# Patient Record
Sex: Male | Born: 1975 | Race: Black or African American | Hispanic: No | Smoking: Current some day smoker
Health system: Southern US, Community
[De-identification: ages and names within clinical notes are randomized; demographics above are authoritative.]

## PROBLEM LIST (undated history)

## (undated) DIAGNOSIS — C801 Malignant (primary) neoplasm, unspecified: Secondary | ICD-10-CM

## (undated) DIAGNOSIS — Z803 Family history of malignant neoplasm of breast: Secondary | ICD-10-CM

## (undated) DIAGNOSIS — Z800 Family history of malignant neoplasm of digestive organs: Secondary | ICD-10-CM

## (undated) HISTORY — PX: ANKLE FRACTURE SURGERY: SHX122

## (undated) HISTORY — DX: Family history of malignant neoplasm of digestive organs: Z80.0

## (undated) HISTORY — DX: Family history of malignant neoplasm of breast: Z80.3

## (undated) HISTORY — PX: FOOT SURGERY: SHX648

---

## 2002-09-16 ENCOUNTER — Emergency Department (HOSPITAL_COMMUNITY): Admission: EM | Admit: 2002-09-16 | Discharge: 2002-09-17 | Payer: Self-pay | Admitting: *Deleted

## 2002-09-16 ENCOUNTER — Encounter: Payer: Self-pay | Admitting: *Deleted

## 2015-05-22 ENCOUNTER — Emergency Department (HOSPITAL_COMMUNITY)
Admission: EM | Admit: 2015-05-22 | Discharge: 2015-05-22 | Disposition: A | Discharge status: Home / Self Care | Payer: Self-pay | Attending: Emergency Medicine | Admitting: Emergency Medicine

## 2015-05-22 ENCOUNTER — Encounter (HOSPITAL_COMMUNITY): Payer: Self-pay | Admitting: Emergency Medicine

## 2015-05-22 ENCOUNTER — Emergency Department (HOSPITAL_COMMUNITY): Payer: Self-pay

## 2015-05-22 DIAGNOSIS — K625 Hemorrhage of anus and rectum: Secondary | ICD-10-CM | POA: Insufficient documentation

## 2015-05-22 DIAGNOSIS — Z720 Tobacco use: Secondary | ICD-10-CM | POA: Insufficient documentation

## 2015-05-22 DIAGNOSIS — R079 Chest pain, unspecified: Secondary | ICD-10-CM | POA: Insufficient documentation

## 2015-05-22 LAB — COMPREHENSIVE METABOLIC PANEL
ALBUMIN: 4.2 g/dL (ref 3.5–5.0)
ALT: 16 U/L — ABNORMAL LOW (ref 17–63)
AST: 24 U/L (ref 15–41)
Alkaline Phosphatase: 45 U/L (ref 38–126)
Anion gap: 8 (ref 5–15)
BUN: 17 mg/dL (ref 6–20)
CHLORIDE: 104 mmol/L (ref 101–111)
CO2: 26 mmol/L (ref 22–32)
Calcium: 9.1 mg/dL (ref 8.9–10.3)
Creatinine, Ser: 1.27 mg/dL — ABNORMAL HIGH (ref 0.61–1.24)
GFR calc Af Amer: >60 mL/min (ref >60)
GFR calc non Af Amer: >60 mL/min (ref >60)
GLUCOSE: 106 mg/dL — AB (ref 65–99)
POTASSIUM: 3.3 mmol/L — AB (ref 3.5–5.1)
SODIUM: 138 mmol/L (ref 135–145)
Total Bilirubin: 0.8 mg/dL (ref 0.3–1.2)
Total Protein: 7.5 g/dL (ref 6.5–8.1)

## 2015-05-22 LAB — I-STAT TROPONIN, ED: Troponin i, poc: 0 ng/mL (ref 0.00–0.08)

## 2015-05-22 LAB — CBC WITH DIFFERENTIAL/PLATELET
BASOS ABS: 0 10*3/uL (ref 0.0–0.1)
BASOS PCT: 0 %
EOS PCT: 0 %
Eosinophils Absolute: 0 10*3/uL (ref 0.0–0.7)
HCT: 38.8 % — ABNORMAL LOW (ref 39.0–52.0)
Hemoglobin: 13.3 g/dL (ref 13.0–17.0)
Lymphocytes Relative: 20 %
Lymphs Abs: 1.7 10*3/uL (ref 0.7–4.0)
MCH: 30.5 pg (ref 26.0–34.0)
MCHC: 34.3 g/dL (ref 30.0–36.0)
MCV: 89 fL (ref 78.0–100.0)
MONO ABS: 0.6 10*3/uL (ref 0.1–1.0)
Monocytes Relative: 7 %
NEUTROS ABS: 6 10*3/uL (ref 1.7–7.7)
Neutrophils Relative %: 73 %
PLATELETS: 212 10*3/uL (ref 150–400)
RBC: 4.36 MIL/uL (ref 4.22–5.81)
RDW: 12.9 % (ref 11.5–15.5)
WBC: 8.2 10*3/uL (ref 4.0–10.5)

## 2015-05-22 LAB — D-DIMER, QUANTITATIVE: D-Dimer, Quant: <0.27 ug/mL-FEU (ref 0.00–0.48)

## 2015-05-22 MED ORDER — HYDROMORPHONE HCL 1 MG/ML IJ SOLN
1.0000 mg | Freq: Once | INTRAMUSCULAR | Status: AC
  Administered 2015-05-22: 1 mg via INTRAMUSCULAR
  Filled 2015-05-22: qty 1

## 2015-05-22 MED ORDER — NAPROXEN 500 MG PO TABS: 500.0000 mg | ORAL_TABLET | Freq: Two times a day (BID) | ORAL | Status: DC

## 2015-05-22 MED ORDER — HYDROCODONE-ACETAMINOPHEN 5-325 MG PO TABS: 1.0000 | ORAL_TABLET | Freq: Four times a day (QID) | ORAL | Status: DC | PRN

## 2015-05-22 NOTE — Discharge Instructions (Signed)
Follow up with a family md if you continue to have chest pain,  Call [phone] for an appointment,    Follow up with dr. Darrick PennaFields for your bleeding from rectum

## 2015-05-22 NOTE — ED Notes (Signed)
Pt c/o of LT sided, non-radiating CP with dizziness and SOB since last night. Pt also reports bright red blood in his stool.

## 2015-05-22 NOTE — ED Notes (Signed)
Pt alert & oriented x4, stable gait. Patient given discharge instructions, paperwork & prescription(s). Patient informed not to drive, operate any equipment & handel any important documents 4 hours after taking pain medication. Patient  instructed to stop at the registration desk to finish any additional paperwork. Patient  verbalized understanding. Pt left department w/ no further questions. 

## 2015-05-22 NOTE — ED Provider Notes (Signed)
CSN: [id number]     Arrival date & time 05/22/15  1709 History   First MD Initiated Contact with Patient 05/22/15 1719     Chief Complaint  Patient presents with  . Chest Pain     (Consider location/radiation/quality/duration/timing/severity/associated sxs/prior Treatment) Patient is a 39 y.o. male presenting with chest pain. The history is provided by the patient (Patient complains of left-sided chest pain worse with movement. Patient also complains of rectal bleeding for a year).  Chest Pain Pain location:  L chest Pain quality: aching   Pain radiates to:  Does not radiate Pain radiates to the back: no   Pain severity:  Moderate Onset quality:  Sudden Timing:  Constant Progression:  Waxing and waning Chronicity:  New Context: not breathing   Associated symptoms: no abdominal pain, no back pain, no cough, no fatigue and no headache     History reviewed. No pertinent past medical history. Past Surgical History  Procedure Laterality Date  . Ankle fracture surgery     No family history on file. Social History  Substance Use Topics  . Smoking status: Current Every Day Smoker -- 1.00 packs/day    Types: Cigarettes  . Smokeless tobacco: None  . Alcohol Use: No    Review of Systems  Constitutional: Negative for appetite change and fatigue.  HENT: Negative for congestion, ear discharge and sinus pressure.   Eyes: Negative for discharge.  Respiratory: Negative for cough.   Cardiovascular: Positive for chest pain.  Gastrointestinal: Negative for abdominal pain and diarrhea.       Rectal bleeding  Genitourinary: Negative for frequency and hematuria.  Musculoskeletal: Negative for back pain.  Skin: Negative for rash.  Neurological: Negative for seizures and headaches.  Psychiatric/Behavioral: Negative for hallucinations.      Allergies  Review of patient's allergies indicates no known allergies.  Home Medications   Prior to Admission medications   Medication Sig  Start Date End Date Taking? Authorizing Provider  HYDROcodone-acetaminophen (NORCO/VICODIN) 5-325 MG tablet Take 1 tablet by mouth every 6 (six) hours as needed for moderate pain. 05/22/15   Bethann BerkshireJoseph Glennie Rodda, MD  naproxen (NAPROSYN) 500 MG tablet Take 1 tablet (500 mg total) by mouth 2 (two) times daily. 05/22/15   Bethann BerkshireJoseph Maren Wiesen, MD   BP 126/62 mmHg  Pulse 76  Temp(Src) 98.3 F (36.8 C) (Oral)  Resp 22  Ht 5\' 11"  (1.803 m)  Wt 175 lb (79.379 kg)  BMI 24.42 kg/m2  SpO2 97% Physical Exam  Constitutional: He is oriented to person, place, and time. He appears well-developed.  HENT:  Head: Normocephalic.  Eyes: Conjunctivae and EOM are normal. No scleral icterus.  Neck: Neck supple. No thyromegaly present.  Cardiovascular: Normal rate and regular rhythm.  Exam reveals no gallop and no friction rub.   No murmur heard. Pulmonary/Chest: No stridor. He has no wheezes. He has no rales. He exhibits tenderness.  Abdominal: He exhibits no distension. There is no tenderness. There is no rebound.  Genitourinary:  Rectal exam showed normal exam heme-negative  Musculoskeletal: Normal range of motion. He exhibits no edema.  Lymphadenopathy:    He has no cervical adenopathy.  Neurological: He is oriented to person, place, and time. He exhibits normal muscle tone. Coordination normal.  Skin: No rash noted. No erythema.  Psychiatric: He has a normal mood and affect. His behavior is normal.    ED Course  Procedures (including critical care time) Labs Review Labs Reviewed  CBC WITH DIFFERENTIAL/PLATELET - Abnormal; Notable for the following:  HCT 38.8 (*)    All other components within normal limits  COMPREHENSIVE METABOLIC PANEL - Abnormal; Notable for the following:    Potassium 3.3 (*)    Glucose, Bld 106 (*)    Creatinine, Ser 1.27 (*)    ALT 16 (*)    All other components within normal limits  D-DIMER, QUANTITATIVE (NOT AT [facility])  I-STAT TROPOININ, ED    Imaging Review Dg Abd Acute  W/chest  05/22/2015  CLINICAL DATA:  LT sided, non-radiating CP with dizziness and SOB, nausea since last night. Pt also reports bright red blood in his stool. No prior history of heart or lung conditions. EXAM: DG ABDOMEN ACUTE W/ 1V CHEST COMPARISON:  None. FINDINGS: There is no evidence of dilated bowel loops or free intraperitoneal air. No radiopaque calculi or other significant radiographic abnormality is seen. Heart size and mediastinal contours are within normal limits. Both lungs are clear. IMPRESSION: Negative abdominal radiographs.  No acute cardiopulmonary disease. Electronically Signed   By: Norva Pavlov M.D.   On: 05/22/2015 18:12   I have personally reviewed and evaluated these images and lab results as part of my medical decision-making.   EKG Interpretation   Date/Time:  Friday May 22 2015 17:19:42 EST Ventricular Rate:  89 PR Interval:  160 QRS Duration: 97 QT Interval:  347 QTC Calculation: 422 R Axis:   92 Text Interpretation:  Sinus rhythm Borderline right axis deviation  Confirmed by Prayan Ulin  MD, Yousaf Sainato ([id number]) on 05/22/2015 7:02:57 PM      MDM   Final diagnoses:  Chest pain at rest  Rectal bleeding    Labs and x-rays unremarkable suspect chest wall pain. Patient is a follow-up with family doctor for his chest pain. He has been referred to GI for the history he has of rectal bleeding    Bethann Berkshire, MD 05/22/15 2103

## 2016-07-22 ENCOUNTER — Emergency Department (HOSPITAL_COMMUNITY)
Admission: EM | Admit: 2016-07-22 | Discharge: 2016-07-23 | Disposition: A | Discharge status: Home / Self Care | Payer: Self-pay | Attending: Emergency Medicine | Admitting: Emergency Medicine

## 2016-07-22 ENCOUNTER — Emergency Department (HOSPITAL_COMMUNITY): Payer: Self-pay

## 2016-07-22 ENCOUNTER — Encounter (HOSPITAL_COMMUNITY): Payer: Self-pay | Admitting: Emergency Medicine

## 2016-07-22 DIAGNOSIS — S20211A Contusion of right front wall of thorax, initial encounter: Secondary | ICD-10-CM | POA: Insufficient documentation

## 2016-07-22 DIAGNOSIS — S80212A Abrasion, left knee, initial encounter: Secondary | ICD-10-CM | POA: Insufficient documentation

## 2016-07-22 DIAGNOSIS — T148XXA Other injury of unspecified body region, initial encounter: Secondary | ICD-10-CM

## 2016-07-22 DIAGNOSIS — Y999 Unspecified external cause status: Secondary | ICD-10-CM | POA: Insufficient documentation

## 2016-07-22 DIAGNOSIS — T07XXXA Unspecified multiple injuries, initial encounter: Secondary | ICD-10-CM

## 2016-07-22 DIAGNOSIS — F17210 Nicotine dependence, cigarettes, uncomplicated: Secondary | ICD-10-CM | POA: Insufficient documentation

## 2016-07-22 DIAGNOSIS — Z23 Encounter for immunization: Secondary | ICD-10-CM | POA: Insufficient documentation

## 2016-07-22 DIAGNOSIS — Y9301 Activity, walking, marching and hiking: Secondary | ICD-10-CM | POA: Insufficient documentation

## 2016-07-22 DIAGNOSIS — S01511A Laceration without foreign body of lip, initial encounter: Secondary | ICD-10-CM | POA: Insufficient documentation

## 2016-07-22 DIAGNOSIS — Y92410 Unspecified street and highway as the place of occurrence of the external cause: Secondary | ICD-10-CM | POA: Insufficient documentation

## 2016-07-22 DIAGNOSIS — S80211A Abrasion, right knee, initial encounter: Secondary | ICD-10-CM | POA: Insufficient documentation

## 2016-07-22 DIAGNOSIS — S0083XA Contusion of other part of head, initial encounter: Secondary | ICD-10-CM
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
## 2016-07-23 ENCOUNTER — Emergency Department (HOSPITAL_COMMUNITY): Payer: Self-pay

## 2016-07-23 MED ORDER — TETANUS-DIPHTH-ACELL PERTUSSIS 5-2.5-18.5 LF-MCG/0.5 IM SUSP: 0.5000 mL | Freq: Once | INTRAMUSCULAR | Status: DC

## 2016-07-23 MED ORDER — BACITRACIN-NEOMYCIN-POLYMYXIN 400-5-5000 EX OINT
TOPICAL_OINTMENT | Freq: Once | CUTANEOUS | Status: AC
  Administered 2016-07-23: 02:00:00 via TOPICAL
  Filled 2016-07-23: qty 2

## 2016-07-23 NOTE — Discharge Instructions (Signed)
Take tylenol and ibuprofen as needed for pain. Return as needed for worsening symptoms.  °

## 2016-07-24 ENCOUNTER — Encounter (HOSPITAL_COMMUNITY): Payer: Self-pay | Admitting: Emergency Medicine
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
## 2022-06-20 DIAGNOSIS — E782 Mixed hyperlipidemia: Secondary | ICD-10-CM | POA: Diagnosis not present

## 2022-06-20 DIAGNOSIS — R7989 Other specified abnormal findings of blood chemistry: Secondary | ICD-10-CM | POA: Diagnosis not present

## 2022-06-20 DIAGNOSIS — E049 Nontoxic goiter, unspecified: Secondary | ICD-10-CM | POA: Diagnosis not present

## 2022-06-20 DIAGNOSIS — Z7721 Contact with and (suspected) exposure to potentially hazardous body fluids: Secondary | ICD-10-CM | POA: Diagnosis not present

## 2022-11-18 DIAGNOSIS — C20 Malignant neoplasm of rectum: Secondary | ICD-10-CM | POA: Insufficient documentation

## 2022-11-21 NOTE — Progress Notes (Signed)
[facility] [address] [place] [address]   Clinic Day:  11/22/2022  Referring physician: Marlise Eves, MD  Patient Care Team: Patient, No Pcp Per as PCP - General (General Practice) Doreatha Massed, MD as Medical Oncologist (Medical Oncology) Therese Sarah, RN as Oncology Nurse Navigator (Medical Oncology)   ASSESSMENT & PLAN:   Assessment:  1.  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 - Colonoscopy and biopsy (10/13/2022): Adenocarcinoma.  MMR preserved. - Evaluated at [facility] cancer care. - Pelvic MRI (10/21/2022): A semi-annular ulcerated T3 N1 low rectal tumor with mucinous features sitting at or just above the level of the sphincter complex without demonstrable infiltration of the sphincter complex.  Lymph node measures 5 mm in short axis with minimum distance 5 mm from MRF at 9:00.  Several additional small rounded lymph nodes seen in the vicinity which fall below the threshold.  No demonstrable extra mesorectal pelvic metastatic disease.  2.  Social/family history: - He is seen with her stepmother (Zachary Nelson) today.  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  Quit smoking 6 months ago.  Smoked half to 1 pack/day starting at age 64. - Sister had stomach cancer.  Another sister had colon polyps removed.  Maternal aunt had lung cancer.  Maternal great grandfather had stomach cancer.  Paternal grandfather had bone cancer.  Paternal aunt had skin cancer.  Another paternal aunt had breast cancer.  Paternal uncle had lung cancer.   Plan:  1.  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 - We discussed TNT approach with chemotherapy followed by chemoradiation followed by surgery. - We will make a referral to Dr. Maisie Fus and radiation oncology. - We will obtain discs of his CT scan and pelvic  MRI and load on our PACS system for review and future comparison.  Will also obtain colonoscopy reports. - Recommend port placement. - Discussed FOLFOX chemotherapy every 2 weeks for 4 months followed by Xeloda with radiation. - Recommend evaluation for genetic testing.  Obtain baseline CEA level. - Will follow-up on Signatera testing ordered at [facility] cancer care.  2.  Rectal pain: - He has pain when he sits down for a long time.  He is taking ibuprofen as needed which is not helping. - We will give him tramadol 50 mg every 8 hours as needed.  I have recommended that he use stool softener.   Orders Placed This Encounter  Procedures   IR IMAGING GUIDED PORT INSERTION    Standing Status:   Future    Standing Expiration Date:   11/22/2023    Order Specific Question:   Reason for Exam (SYMPTOM  OR DIAGNOSIS REQUIRED)    Answer:   Stage III Rectal Cancer    Order Specific Question:   Preferred Imaging Location?    Answer:   [facility]    Order Specific Question:   Release to patient    Answer:   Immediate   CEA    Standing Status:   Future    Standing Expiration Date:   11/29/2023   Magnesium    Standing Status:   Future    Standing Expiration Date:   11/29/2023   CBC with Differential    Standing Status:   Future    Standing Expiration Date:   11/29/2023  Comprehensive metabolic panel    Standing Status:   Future    Standing Expiration Date:   11/29/2023   Magnesium    Standing Status:   Future    Standing Expiration Date:   12/13/2023   CBC with Differential    Standing Status:   Future    Standing Expiration Date:   12/13/2023   Comprehensive metabolic panel    Standing Status:   Future    Standing Expiration Date:   12/13/2023   Magnesium    Standing Status:   Future    Standing Expiration Date:   12/27/2023   CBC with Differential    Standing Status:   Future    Standing Expiration Date:   12/27/2023   Comprehensive metabolic panel    Standing Status:   Future     Standing Expiration Date:   12/27/2023   Magnesium    Standing Status:   Future    Standing Expiration Date:   01/10/2024   CBC with Differential    Standing Status:   Future    Standing Expiration Date:   01/10/2024   Comprehensive metabolic panel    Standing Status:   Future    Standing Expiration Date:   01/10/2024   Magnesium    Standing Status:   Future    Standing Expiration Date:   01/24/2024   CBC with Differential    Standing Status:   Future    Standing Expiration Date:   01/24/2024   Comprehensive metabolic panel    Standing Status:   Future    Standing Expiration Date:   01/24/2024   Magnesium    Standing Status:   Future    Standing Expiration Date:   02/07/2024   CBC with Differential    Standing Status:   Future    Standing Expiration Date:   02/07/2024   Comprehensive metabolic panel    Standing Status:   Future    Standing Expiration Date:   02/07/2024   Magnesium    Standing Status:   Future    Standing Expiration Date:   02/21/2024   CBC with Differential    Standing Status:   Future    Standing Expiration Date:   02/21/2024   Comprehensive metabolic panel    Standing Status:   Future    Standing Expiration Date:   02/21/2024   Magnesium    Standing Status:   Future    Standing Expiration Date:   03/06/2024   CBC with Differential    Standing Status:   Future    Standing Expiration Date:   03/06/2024   Comprehensive metabolic panel    Standing Status:   Future    Standing Expiration Date:   03/06/2024   Ambulatory referral to Genetics    Referral Priority:   Routine    Referral Type:   Consultation    Referral Reason:   Specialty Services Required    Number of Visits Requested:   1      I,Katie Daubenspeck,acting as a scribe for Doreatha Massed, MD.,have documented all relevant documentation on the behalf of Doreatha Massed, MD,as directed by  Doreatha Massed, MD while in the presence of Doreatha Massed, MD.   I, Doreatha Massed MD, have  reviewed the above documentation for accuracy and completeness, and I agree with the above.   Doreatha Massed, MD   5/14/20244:35 PM  CHIEF COMPLAINT/PURPOSE OF CONSULT:   Diagnosis: rectal adenocarcinoma   Cancer Staging  Rectal adenocarcinoma [facility]) Staging  form: Colon and Rectum, AJCC 8th Edition - Clinical stage from 11/18/2022: Stage IIIB (cT3, cN1b, cM0) - Unsigned    Prior Therapy: none  Current Therapy: FOLFOX neoadjuvant   HISTORY OF PRESENT ILLNESS:   Oncology History  Rectal adenocarcinoma (HCC)  11/18/2022 Initial Diagnosis   Rectal adenocarcinoma (HCC)   11/29/2022 -  Chemotherapy   Patient is on Treatment Plan : COLORECTAL FOLFOX q14d x 4 months         Zachary Nelson is a 47 y.o. male presenting to clinic today for evaluation of rectal adenocarcinoma at the request of Dr. Johnnette Barrios.  He initially developed hematochezia in 07/2022. He underwent colonoscopy with Dr. Myer Haff in [place], [place] on 10/13/22 showing a 7 cm rectal mass at 3 cm from anal verge. Pathology from the mass confirmed adenocarcinoma, MMR normal.  He underwent staging rectal MRI on 10/21/22 showing: stage T3 N1 low rectal tumor with mucinous features, sitting at or just above level of sphincter complex without demonstrable infiltration; no extra-mesorectal pelvic metastatic disease.  He met Dr. Rosanne Ashing in oncology in [place] on 10/25/22. Per their notes, they discussed potential treatment options, and patient was most interested in concurrent chemoradiation with Xeloda followed by Xeloda alone.  Today, he states that he is doing well overall. His appetite level is at 100%. His energy level is at 100%.  PAST MEDICAL HISTORY:   Past Medical History: No past medical history on file.  Surgical History: Past Surgical History:  Procedure Laterality Date   ANKLE FRACTURE SURGERY     FOOT SURGERY      Social History: Social History   Socioeconomic History   Marital status: Divorced    Spouse name:  Not on file   Number of children: Not on file   Years of education: Not on file   Highest education level: Not on file  Occupational History   Not on file  Tobacco Use   Smoking status: Every Day    Packs/day: 1    Types: Cigarettes   Smokeless tobacco: Never  Substance and Sexual Activity   Alcohol use: Yes   Drug use: Yes    Types: Marijuana   Sexual activity: Not on file  Other Topics Concern   Not on file  Social History Narrative   ** Merged History Encounter **       Social Determinants of Health   Financial Resource Strain: Not on file  Food Insecurity: No Food Insecurity (11/22/2022)   Hunger Vital Sign    Worried About Running Out of Food in the Last Year: Never true    Ran Out of Food in the Last Year: Never true  Transportation Needs: No Transportation Needs (11/22/2022)   PRAPARE - Administrator, Civil Service (Medical): No    Lack of Transportation (Non-Medical): No  Physical Activity: Not on file  Stress: Not on file  Social Connections: Not on file  Intimate Partner Violence: Not At Risk (11/22/2022)   Humiliation, Afraid, Rape, and Kick questionnaire    Fear of Current or Ex-Partner: No    Emotionally Abused: No    Physically Abused: No    Sexually Abused: No    Family History: No family history on file.  Current Medications: No current outpatient medications on file.   Allergies: Allergies  Allergen Reactions   Bee Venom     REVIEW OF SYSTEMS:   Review of Systems  Constitutional:  Negative for chills, fatigue and fever.  HENT:   Negative  for lump/mass, mouth sores, nosebleeds, sore throat and trouble swallowing.   Eyes:  Negative for eye problems.  Respiratory:  Negative for cough and shortness of breath.   Cardiovascular:  Negative for chest pain, leg swelling and palpitations.  Gastrointestinal:  Positive for constipation and diarrhea. Negative for abdominal pain, nausea and vomiting.  Genitourinary:  Negative for bladder  incontinence, difficulty urinating, dysuria, frequency, hematuria and nocturia.   Musculoskeletal:  Negative for arthralgias, back pain, flank pain, myalgias and neck pain.       Rectal pain  Skin:  Negative for itching and rash.  Neurological:  Negative for dizziness, headaches and numbness.  Hematological:  Does not bruise/bleed easily.  Psychiatric/Behavioral:  Negative for depression, sleep disturbance and suicidal ideas. The patient is not nervous/anxious.   All other systems reviewed and are negative.    VITALS:   Blood pressure 111/83, pulse 100, temperature 97.8 F (36.6 C), temperature source Tympanic, resp. rate 19, height 6' (1.829 m), weight 206 lb 4.8 oz (93.6 kg), SpO2 98 %.  Wt Readings from Last 3 Encounters:  11/22/22 206 lb 4.8 oz (93.6 kg)  07/22/16 165 lb (74.8 kg)  05/22/15 175 lb (79.4 kg)    Body mass index is 27.98 kg/m.  Performance status (ECOG): 0 - Asymptomatic  PHYSICAL EXAM:   Physical Exam Vitals and nursing note reviewed. Exam conducted with a chaperone present.  Constitutional:      Appearance: Normal appearance.  Cardiovascular:     Rate and Rhythm: Normal rate and regular rhythm.     Pulses: Normal pulses.     Heart sounds: Normal heart sounds.  Pulmonary:     Effort: Pulmonary effort is normal.     Breath sounds: Normal breath sounds.  Abdominal:     Palpations: Abdomen is soft. There is no hepatomegaly, splenomegaly or mass.     Tenderness: There is no abdominal tenderness.  Musculoskeletal:     Right lower leg: No edema.     Left lower leg: No edema.  Lymphadenopathy:     Cervical: No cervical adenopathy.     Right cervical: No superficial, deep or posterior cervical adenopathy.    Left cervical: No superficial, deep or posterior cervical adenopathy.     Upper Body:     Right upper body: No supraclavicular or axillary adenopathy.     Left upper body: No supraclavicular or axillary adenopathy.  Neurological:     General: No focal  deficit present.     Mental Status: He is alert and oriented to person, place, and time.  Psychiatric:        Mood and Affect: Mood normal.        Behavior: Behavior normal.     LABS:      Latest Ref Rng & Units 05/22/2015    5:42 PM  CBC  WBC 4.0 - 10.5 K/uL 8.2   Hemoglobin 13.0 - 17.0 g/dL 16.1   Hematocrit 09.6 - 52.0 % 38.8   Platelets 150 - 400 K/uL 212       Latest Ref Rng & Units 05/22/2015    5:42 PM  CMP  Glucose 65 - 99 mg/dL 045   BUN 6 - 20 mg/dL 17   Creatinine 4.09 - 1.24 mg/dL 8.11   Sodium 914 - 782 mmol/L 138   Potassium 3.5 - 5.1 mmol/L 3.3   Chloride 101 - 111 mmol/L 104   CO2 22 - 32 mmol/L 26   Calcium 8.9 - 10.3 mg/dL 9.1  Total Protein 6.5 - 8.1 g/dL 7.5   Total Bilirubin 0.3 - 1.2 mg/dL 0.8   Alkaline Phos 38 - 126 U/L 45   AST 15 - 41 U/L 24   ALT 17 - 63 U/L 16      No results found for: "CEA1", "CEA" / No results found for: "CEA1", "CEA" No results found for: "PSA1" No results found for: "WUJ811" No results found for: "CAN125"  No results found for: "TOTALPROTELP", "ALBUMINELP", "A1GS", "A2GS", "BETS", "BETA2SER", "GAMS", "MSPIKE", "SPEI" No results found for: "TIBC", "FERRITIN", "IRONPCTSAT" No results found for: "LDH"   STUDIES:   No results found.

## 2022-11-22 ENCOUNTER — Inpatient Hospital Stay: Payer: 59 | Attending: Hematology | Admitting: Hematology

## 2022-11-22 ENCOUNTER — Encounter: Payer: Self-pay | Admitting: Hematology

## 2022-11-22 ENCOUNTER — Other Ambulatory Visit: Payer: Self-pay

## 2022-11-22 VITALS — BP 111/83 | HR 100 | Temp 97.8°F | Resp 19 | Ht 72.0 in | Wt 206.3 lb

## 2022-11-22 DIAGNOSIS — Z452 Encounter for adjustment and management of vascular access device: Secondary | ICD-10-CM | POA: Insufficient documentation

## 2022-11-22 DIAGNOSIS — Z5111 Encounter for antineoplastic chemotherapy: Secondary | ICD-10-CM | POA: Insufficient documentation

## 2022-11-22 DIAGNOSIS — C20 Malignant neoplasm of rectum: Secondary | ICD-10-CM | POA: Diagnosis not present

## 2022-11-22 MED ORDER — TRAMADOL HCL 50 MG PO TABS: 50.0000 mg | ORAL_TABLET | Freq: Three times a day (TID) | ORAL | 0 refills | Status: DC | PRN

## 2022-11-22 NOTE — Patient Instructions (Addendum)
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 You will be treated in the clinic every 2 weeks for a total of 4 months with a combination of chemotherapy drugs. Those drugs are oxaliplatin and fluorouracil (5FU). You will also receive a drug called leucovorin. This is not a chemotherapy drug, but a vitamin that helps the 5FU work better. The intent of treatment is cure. You will see the doctor regularly throughout treatment.  We will obtain blood work from you prior to every treatment and monitor your results to make sure it is safe to give your treatment. The doctor monitors your response to treatment Yby the way you are feeling, your blood work, and by obtaining scans periodically.  There will be wait times while you are here for treatment.  It will take about 30 minutes to 1 hour for your lab work to result.  Then there will be wait times while pharmacy mixes your medications.     Medications you will receive in the clinic prior to your chemotherapy medications:  Aloxi:  ALOXI is used in adults to help prevent nausea and vomiting that happens with certain chemotherapy drugs.  Aloxi is a long acting medication, and will remain in your system for about two days.   Dexamethasone:  This is a steroid given prior to chemotherapy to help prevent allergic reactions; it may also help prevent and control nausea and diarrhea.     Oxaliplatin (Eloxatin)  About This Drug  Oxaliplatin is used to treat cancer. It is given in the vein (IV).  It takes two hours to infuse.  Possible Side Effects   Bone marrow suppression. This is a decrease in the number of white blood cells, red blood cells, and platelets. This may raise your risk of infection, make you tired and weak (fatigue), and raise your risk of bleeding.   Tiredness   Soreness of the mouth and throat. You may have red areas, white patches, or sores that hurt.   Nausea and vomiting  (throwing up)   Diarrhea (loose bowel movements)   Changes in your liver function   Effects on the nerves called peripheral neuropathy. You may feel numbness, tingling, or pain in your hands and feet, and may be worse in cold temperatures. It may be hard for you to button your clothes, open jars, or walk as usual. The effect on the nerves may get worse with more doses of the drug. These effects get better in some people after the drug is stopped but it does not get better in all people  Note: Each of the side effects above was reported in 40% or greater of patients treated with oxaliplatin. Not all possible side effects are included above.   Warnings and Precautions   Allergic reactions, including anaphylaxis, which may be life-threatening are rare but may happen in some patients. Signs of allergic reaction to this drug may be swelling of the face, feeling like your tongue or throat are swelling, trouble breathing, rash, itching, fever, chills, feeling dizzy, and/or feeling that your heart is beating in a fast or not normal way. If this happens, do not take another dose of this drug. You should get urgent medical treatment.   Inflammation (swelling) of the lungs, which may be life-threatening. You may have a dry cough or trouble breathing.   Effects on the nerves (neuropathy) may resolve within 14 days, or it may persist beyond 14 days.   Severe decrease in  white blood cells when combined with the chemotherapy agents 5-fluorouracil and leucovorin. This may be life-threatening.   Severe changes in your liver function   Abnormal heart beat and/or EKG, which can be life-threatening   Rhabdomyolysis- damage to your muscles which may release proteins in your blood and affect how your kidneys work, which can be life-threatening. You may have severe muscle weakness and/or pain, or dark urine.  Important Information   This drug may impair your ability to drive or use machinery. Talk to your  doctor and/or nurse about precautions you may need to take.   This drug may be present in the saliva, tears, sweat, urine, stool, vomit, semen, and vaginal secretions. Talk to your doctor and/or your nurse about the necessary precautions to take during this time.  * The effects on the nerves can be aggravated by exposure to cold. Avoid cold beverages, the use of ice, and make sure you cover your skin and dress warmly prior to being exposed to cold temperatures while you are receiving treatment with oxaliplatin*   Treating Side Effects   Manage tiredness by pacing your activities for the day.   Be sure to include periods of rest between energy-draining activities.   To decrease the risk of infection, wash your hands regularly.   Avoid close contact with people who have a cold, the flu, or other infections.  Take your temperature as your doctor or nurse tells you, and whenever you feel like you may have a fever.   To help decrease the risk of bleeding, use a soft toothbrush. Check with your nurse before using dental floss.   Be very careful when using knives or tools.   Use an electric shaver instead of a razor.   Drink plenty of fluids (a minimum of eight glasses per day is recommended).   Mouth care is very important. Your mouth care should consist of routine, gentle cleaning of your teeth or dentures and rinsing your mouth with a mixture of 1/2 teaspoon of salt in 8 ounces of water or 1/2 teaspoon of baking soda in 8 ounces of water. This should be done at least after each meal and at bedtime.   If you have mouth sores, avoid mouthwash that has alcohol. Also avoid alcohol and smoking because they can bother your mouth and throat.   To help with nausea and vomiting, eat small, frequent meals instead of three large meals a day. Choose foods and drinks that are at room temperature. Ask your nurse or doctor about other helpful tips and medicine that is available to help stop or lessen these  symptoms.   If you throw up or have loose bowel movements, you should drink more fluids so that you do not become dehydrated (lack of water in the body from losing too much fluid).   If you have diarrhea, eat low-fiber foods that are high in protein and calories and avoid foods that can irritate your digestive tracts or lead to cramping.   Ask your nurse or doctor about medicine that can lessen or stop your diarrhea.   If you have numbness and tingling in your hands and feet, be careful when cooking, walking, and handling sharp objects and hot liquids.   Do not drink cold drinks or use ice in beverages. Drink fluids at room temperature or warmer, and drink through a straw.   Wear gloves to touch cold objects, and wear warm clothing and cover you skin during cold weather.   Food and  Drug Interactions   There are no known interactions of oxaliplatin with food and other medications.   This drug may interact with other medicines. Tell your doctor and pharmacist about all the prescription and over-the-counter medicines and dietary supplements (vitamins, minerals, herbs and others) that you are taking at this time. Also, check with your doctor or pharmacist before starting any new prescription or over-the-counter medicines, or dietary supplements to make sure that there are no interactions   When to Call the Doctor  Call your doctor or nurse if you have any of these symptoms and/or any new or unusual symptoms:   Fever of 100.4 F (38 C) or higher   Chills   Tiredness that interferes with your daily activities   Feeling dizzy or lightheaded   Easy bleeding or bruising   Feeling that your heart is beating in a fast or not normal way (palpitations)   Pain in your chest   Dry cough   Trouble breathing   Pain in your mouth or throat that makes it hard to eat or drink   Nausea that stops you from eating or drinking and/or is not relieved by prescribed medicines   Throwing up    Diarrhea, 4 times in one day or diarrhea with lack of strength or a feeling of being dizzy   Numbness, tingling, or pain in your hands and feet   Signs of possible liver problems: dark urine, pale bowel movements, bad stomach pain, feeling very tired and weak, unusual itching, or yellowing of the eyes or skin   Signs of rhabdomyolysis: decreased urine, very dark urine, muscle pain in the shoulders, thighs, or lower back; muscle weakness or trouble moving arms and legs   Signs of allergic reaction: swelling of the face, feeling like your tongue or throat are swelling, trouble breathing, rash, itching, fever, chills, feeling dizzy, and/or feeling that your heart is beating in a fast or not normal way. If this happens, call 911 for emergency care.   If you think you may be pregnant  Reproduction Warnings   Pregnancy warning: This drug may have harmful effects on the unborn baby. Women of childbearing potential should use effective methods of birth control during your cancer treatment. Let your doctor know right away if you think you may be pregnant or may have impregnated your partner.   Breastfeeding warning: It is not known if this drug passes into breast milk. For this reason, women should talk to their doctor about the risks and benefits of breastfeeding during treatment with this drug because this drug may enter the breast milk and cause harm to a breastfeeding baby.   Fertility warning: Human fertility studies have not been done with this drug. Talk with your doctor or nurse if you plan to have children. Ask for information on sperm or egg banking.   Leucovorin Calcium  About This Drug  Leucovorin is a vitamin. It is used in combination with other cancer fighting drugs such as 5-fluorouracil and methotrexate. Leucovorin is given in the vein (IV).  This drug runs at the same time as the oxaliplatin and takes 2 hours to infuse.   Possible Side Effects  Rash and itching  Note:  Leucovorin by itself has very few side effects. Other side effects you may have can be caused by the other drugs you are taking, such as 5-fluorouracil.   Warnings and Precautions   Allergic reactions, including anaphylaxis are rare but may happen in some patients. Signs of allergic reaction  to this drug may be swelling of the face, feeling like your tongue or throat are swelling, trouble breathing, rash, itching, fever, chills, feeling dizzy, and/or feeling that your heart is beating in a fast or not normal way. If this happens, do not take another dose of this drug. You should get urgent medical treatment.  Food and Drug Interactions   There are no known interactions of leucovorin with food.   This drug may interact with other medicines. Tell your doctor and pharmacist about all the prescription and over-the-counter medicines and dietary supplements (vitamins, minerals, herbs and others) that you are taking at this time.   Also, check with your doctor or pharmacist before starting any new prescription or over-the-counter medicines, or dietary supplements to make sure that there are no interactions.   When to Call the Doctor  Call your doctor or nurse if you have any of these symptoms and/or any new or unusual symptoms:   A new rash or a rash that is not relieved by prescribed medicines   Signs of allergic reaction: swelling of the face, feeling like your tongue or throat are swelling, trouble breathing, rash, itching, fever, chills, feeling dizzy, and/or feeling that your heart is beating in a fast or not normal way. If this happens, call 911 for emergency care.   If you think you may be pregnant   Reproduction Warnings   Pregnancy warning: It is not known if this drug may harm an unborn child. For this reason, be sure to talk with your doctor if you are pregnant or planning to become pregnant while receiving this drug. Let your doctor know right away if you think you may be  pregnant   Breastfeeding warning: It is not known if this drug passes into breast milk. For this reason, women should talk to their doctor about the risks and benefits of breastfeeding during treatment with this drug because this drug may enter the breast milk and cause harm to a breastfeeding baby.   Fertility warning: Human fertility studies have not been done with this drug. Talk with your doctor or nurse if you plan to have children. Ask for information on sperm or egg banking.   5-Fluorouracil (Adrucil; 5FU)  About This Drug  Fluorouracil is used to treat cancer. It is given in the vein (IV). It is given as an IV push from a syringe and also as a continuous infusion given via an ambulatory pump (a pump you take home and wear for a specified amount of time).  Possible Side Effects   Bone marrow suppression. This is a decrease in the number of white blood cells, red blood cells, and platelets. This may raise your risk of infection, make you tired and weak (fatigue), and raise your risk of bleeding   Changes in the tissue of the heart and/or heart attack. Some changes may happen that can cause your heart to have less ability to pump blood.   Blurred vision or other changes in eyesight   Nausea and throwing up (vomiting)   Diarrhea (loose bowel movements)   Ulcers - sores that may cause pain or bleeding in your digestive tract, which includes your mouth, esophagus, stomach, small/large intestines and rectum   Soreness of the mouth and throat. You may have red areas, white patches, or sores that hurt.   Allergic reactions, including anaphylaxis are rare but may happen in some patients. Signs of allergic reaction to this drug may be swelling of the face, feeling like  your tongue or throat are swelling, trouble breathing, rash, itching, fever, chills, feeling dizzy, and/or feeling that your heart is beating in a fast or not normal way. If this happens, do not take another dose of this  drug. You should get urgent medical treatment.   Sensitivity to light (photosensitivity). Photosensitivity means that you may become more sensitive to the sun and/or light. You may get a skin rash/reaction if you are in the sun or are exposed to sun lamps and tanning beds. Your eyes may water more, mostly in bright light.   Changes in your nail color, nail loss and/or brittle nail   Darkening of the skin, or changes to the color of your skin and/or veins used for infusion   Rash, dry skin, or itching  Note: Not all possible side effects are included above.  Warnings and Precautions   Hand-and-foot syndrome. The palms of your hands or soles of your feet may tingle, become numb, painful, swollen, or red.   Changes in your central nervous system can happen. The central nervous system is made up of your brain and spinal cord. You could feel extreme tiredness, agitation, confusion, hallucinations (see or hear things that are not there), trouble understanding or speaking, loss of control of your bowels or bladder, eyesight changes, numbness or lack of strength to your arms, legs, face, or body, or coma. If you start to have any of these symptoms let your doctor know right away.   Side effects of this drug may be unexpectedly severe in some patients  Note: Some of the side effects above are very rare. If you have concerns and/or questions, please discuss them with your medical team.   Important Information   This drug may be present in the saliva, tears, sweat, urine, stool, vomit, semen, and vaginal secretions. Talk to your doctor and/or your nurse about the necessary precautions to take during this time.   Treating Side Effects   Manage tiredness by pacing your activities for the day.   Be sure to include periods of rest between energy-draining activities.   To help decrease the risk of infections, wash your hands regularly.   Avoid close contact with people who have a cold, the flu,  or other infections.   Take your temperature as your doctor or nurse tells you, and whenever you feel like you may have a fever.   Use a soft toothbrush. Check with your nurse before using dental floss.   Be very careful when using knives or tools.   Use an electric shaver instead of a razor.   If you have a nose bleed, sit with your head tipped slightly forward. Apply pressure by lightly pinching the bridge of your nose between your thumb and forefinger. Call your doctor if you feel dizzy or faint or if the bleeding doesn't stop after 10 to 15 minutes.   Drink plenty of fluids (a minimum of eight glasses per day is recommended).   If you throw up or have loose bowel movements, you should drink more fluids so that you do not  become dehydrated (lack of water in the body from losing too much fluid).   To help with nausea and vomiting, eat small, frequent meals instead of three large meals a day. Choose foods and drinks that are at room temperature. Ask your nurse or doctor about other helpful tips and medicine that is available to help, stop, or lessen these symptoms.   If you have diarrhea, eat low-fiber foods  that are high in protein and calories and avoid foods that can irritate your digestive tracts or lead to cramping.   Ask your nurse or doctor about medicine that can lessen or stop your diarrhea.   Mouth care is very important. Your mouth care should consist of routine, gentle cleaning of your teeth or dentures and rinsing your mouth with a mixture of 1/2 teaspoon of salt in 8 ounces of water or 1/2 teaspoon of baking soda in 8 ounces of water. This should be done at least after each meal and at bedtime.   If you have mouth sores, avoid mouthwash that has alcohol. Also avoid alcohol and smoking because they can bother your mouth and throat.   Keeping your nails moisturized may help with brittleness.   To help with itching, moisturize your skin several times day.   Use sunscreen  with SPF 30 or higher when you are outdoors even for a short time. Cover up when you are out in the sun. Wear wide-brimmed hats, long-sleeved shirts, and pants. Keep your neck, chest, and back covered. Wear dark sun glasses when in the sun or bright lights.   If you get a rash do not put anything on it unless your doctor or nurse says you may. Keep the area around the rash clean and dry. Ask your doctor for medicine if your rash bothers you.   Keeping your pain under control is important to your well-being. Please tell your doctor or nurse if you are experiencing pain.   Food and Drug Interactions   There are no known interactions of fluorouracil with food.   Check with your doctor or pharmacist about all other prescription medicines and over-the-counter medicines and dietary supplements (vitamins, minerals, herbs and others) you are taking before starting this medicine as there are known drug interactions with 5-fluoroucacil. Also, check with your doctor or pharmacist before starting any new prescription or over-the-counter medicines, or dietary supplements to make sure that there are no interactions.  When to Call the Doctor  Call your doctor or nurse if you have any of these symptoms and/or any new or unusual symptoms:   Fever of 100.4 F (38 C) or higher   Chills   Easy bleeding or bruising   Nose bleed that doesn't stop bleeding after 10-15 minutes   Trouble breathing   Feeling dizzy or lightheaded   Feeling that your heart is beating in a fast or not normal way (palpitations)   Chest pain or symptoms of a heart attack. Most heart attacks involve pain in the center of the chest that lasts more than a few minutes. The pain may go away and come back or it can be constant. It can feel like pressure, squeezing, fullness, or pain. Sometimes pain is felt in one or both arms, the back, neck, jaw, or stomach. If any of these symptoms last 2 minutes, call 911.   Confusion and/or  agitation   Hallucinations   Trouble understanding or speaking   Loss of control of bowels or bladder   Blurry vision or changes in your eyesight   Headache that does not go away   Numbness or lack of strength to your arms, legs, face, or body   Nausea that stops you from eating or drinking and/or is not relieved by prescribed medicines   Throwing up more than 3 times a day   Diarrhea, 4 times in one day or diarrhea with lack of strength or a feeling of being dizzy  Pain in your mouth or throat that makes it hard to eat or drink   Pain along the digestive tract - especially if worse after eating   Blood in your vomit (bright red or coffee-ground) and/or stools (bright red, or black/tarry)   Coughing up blood   Tiredness that interferes with your daily activities   Painful, red, or swollen areas on your hands or feet or around your nails   A new rash or a rash that is not relieved by prescribed medicines   Develop sensitivity to sunlight/light   Numbness and/or tingling of your hands and/or feet   Signs of allergic reaction: swelling of the face, feeling like your tongue or throat are swelling, trouble breathing, rash, itching, fever, chills, feeling dizzy, and/or feeling that your heart is beating in a fast or not normal way. If this happens, call 911 for emergency care.   If you think you are pregnant or may have impregnated your partner  Reproduction Warnings   Pregnancy warning: This drug may have harmful effects on the unborn baby. Women of child bearing potential should use effective methods of birth control during your cancer treatment and 3 months after treatment. Men with male partners of childbearing potential should use effective methods of birth control during your cancer treatment and for 3 months after your cancer treatment. Let your doctor know right away if you think you may be pregnant or may have impregnated your partner.   Breastfeeding warning: It  is not known if this drug passes into breast milk. For this reason, Women should not breastfeed during treatment because this drug could enter the breast milk and cause harm to a breastfeeding baby.   Fertility warning: In men and women both, this drug may affect your ability to have children in the future. Talk with your doctor or nurse if you plan to have children. Ask for information on sperm or egg banking.   SELF CARE ACTIVITIES WHILE ON CHEMOTHERAPY/IMMUNOTHERAPY:  Hydration Increase your fluid intake and drink at least 64 ounces (2 liters) of water/decaffeinated beverages per day after treatment. You can still have your cup of coffee or soda but these beverages do not count as part of the 64 ounces that you need to drink daily. Limit alcohol intake.  Medications Continue taking your normal prescription medication as prescribed.  If you start any new herbal or new supplements please let us know first to make sure it is safe.  Mouth Care Have teeth cleaned professionally before starting treatment. Keep dentures and partial plates clean. Use soft toothbrush and do not use mouthwashes that contain alcohol. Biotene is a good mouthwash that is available at most pharmacies or may be ordered by calling [phone]. Use warm salt water gargles (1 teaspoon salt per 1 quart warm water) before and after meals and at bedtime. If you are still having problems with your mouth or sores in your mouth please call the clinic. If you need dental work, please let the doctor know before you go for your appointment so that we can coordinate the best possible time for you in regards to your chemo regimen. You need to also let your dentist know that you are actively taking chemo. We may need to do labs prior to your dental appointment.  Skin Care Always use sunscreen that has not expired and with SPF (Sun Protection Factor) of 50 or higher. Wear hats to protect your head from the sun. Remember to use sunscreen on  your hands,  ears, face, & feet.  Use good moisturizing lotions such as udder cream, eucerin, or even Vaseline. Some chemotherapies can cause dry skin, color changes in your skin and nails.    Avoid long, hot showers or baths. Use gentle, fragrance-free soaps and laundry detergent. Use moisturizers, preferably creams or ointments rather than lotions because the thicker consistency is better at preventing skin dehydration. Apply the cream or ointment within 15 minutes of showering. Reapply moisturizer at night, and moisturize your hands every time after you wash them.   Infection Prevention Please wash your hands for at least 30 seconds using warm soapy water. Handwashing is the #1 way to prevent the spread of germs. Stay away from sick people or people who are getting over a cold. If you develop respiratory systems such as green/yellow mucus production or productive cough or persistent cough let us know and we will see if you need an antibiotic. It is a good idea to keep a pair of gloves on when going into grocery stores/[employer] to decrease your risk of coming into contact with germs on the carts, etc. Carry alcohol hand gel with you at all times and use it frequently if out in public. If your temperature reaches 100.5 or higher please call the clinic and let us know.  If it is after hours or on the weekend please go to the ER if your temperature is over 100.4.  Please have your own personal thermometer at home to use.    Sex and bodily fluids If you are going to have sex, a condom must be used to protect the person that isn't taking immunotherapy. For a few days after treatment, immunotherapy can be excreted through your bodily fluids.  When using the toilet please close the lid and flush the toilet twice.  Do this for a few day after you have had immunotherapy.   Contraception It is not known for sure whether or not immunotherapy drugs can be passed on through semen or secretions from the vagina.  Because of this some doctors advise people to use a barrier method if you have sex during treatment. This applies to vaginal, anal or oral sex.  Generally, doctors advise a barrier method only for the time you are actually having the treatment and for about a week after your treatment.  Advice like this can be worrying, but this does not mean that you have to avoid being intimate with your partner. You can still have close contact with your partner and continue to enjoy sex.  Animals If you have cats or birds we ask that you not change the litter or change the cage.  Please have someone else do this for you while you are on immunotherapy.   Food Safety During and After Cancer Treatment Food safety is important for people both during and after cancer treatment. Cancer and cancer treatments, such as chemotherapy, radiation therapy, and stem cell/bone marrow transplantation, often weaken the immune system. This makes it harder for your body to protect itself from foodborne illness, also called food poisoning. Foodborne illness is caused by eating food that contains harmful bacteria, parasites, or viruses.  Foods to avoid Some foods have a higher risk of becoming tainted with bacteria. These include: Unwashed fresh fruit and vegetables, especially leafy vegetables that can hide dirt and other contaminants Raw sprouts, such as alfalfa sprouts Raw or undercooked beef, especially ground beef, or other raw or undercooked meat and poultry Fatty, fried, or spicy foods immediately before or after  treatment.  These can sit heavy on your stomach and make you feel nauseous. Raw or undercooked shellfish, such as oysters. Sushi and sashimi, which often contain raw fish.  Unpasteurized beverages, such as unpasteurized fruit juices, raw milk, raw yogurt, or cider Undercooked eggs, such as soft boiled, over easy, and poached; raw, unpasteurized eggs; or foods made with raw egg, such as homemade raw cookie dough  and homemade mayonnaise  Simple steps for food safety  Shop smart. Do not buy food stored or displayed in an unclean area. Do not buy bruised or damaged fruits or vegetables. Do not buy cans that have cracks, dents, or bulges. Pick up foods that can spoil at the end of your shopping trip and store them in a cooler on the way home.  Prepare and clean up foods carefully. Rinse all fresh fruits and vegetables under running water, and dry them with a clean towel or paper towel. Clean the top of cans before opening them. After preparing food, wash your hands for 20 seconds with hot water and soap. Pay special attention to areas between fingers and under nails. Clean your utensils and dishes with hot water and soap. Disinfect your kitchen and cutting boards using 1 teaspoon of liquid, unscented bleach mixed into 1 quart of water.    Dispose of old food. Eat canned and packaged food before its expiration date (the "use by" or "best before" date). Consume refrigerated leftovers within 3 to 4 days. After that time, throw out the food. Even if the food does not smell or look spoiled, it still may be unsafe. Some bacteria, such as Listeria, can grow even on foods stored in the refrigerator if they are kept for too long.  Take precautions when eating out. At restaurants, avoid buffets and salad bars where food sits out for a long time and comes in contact with many people. Food can become contaminated when someone with a virus, often a norovirus, or another "bug" handles it. Put any leftover food in a "to-go" container yourself, rather than having the server do it. And, refrigerate leftovers as soon as you get home. Choose restaurants that are clean and that are willing to prepare your food as you order it cooked.    SYMPTOMS TO REPORT AS SOON AS POSSIBLE AFTER TREATMENT:  FEVER GREATER THAN 100.4 F CHILLS WITH OR WITHOUT FEVER NAUSEA AND VOMITING THAT IS NOT CONTROLLED WITH YOUR NAUSEA  MEDICATION UNUSUAL SHORTNESS OF BREATH UNUSUAL BRUISING OR BLEEDING TENDERNESS IN MOUTH AND THROAT WITH OR WITHOUT PRESENCE OF ULCERS URINARY PROBLEMS BOWEL PROBLEMS UNUSUAL RASH     Wear comfortable clothing and clothing appropriate for easy access to any Portacath or PICC line. Let us know if there is anything that we can do to make your therapy better!   What to do if you need assistance after hours or on the weekends: CALL [phone].  HOLD on the line, do not hang up.  You will hear multiple messages but at the end you will be connected with a nurse triage line.  They will contact the doctor if necessary.  Most of the time they will be able to assist you.  Do not call the hospital operator.    I have been informed and understand all of the instructions given to me and have received a copy. I have been instructed to call the clinic [phone] or my family physician as soon as possible for continued medical care, if indicated. I do not have any  more questions at this time but understand that I may call the Cancer Center or the Patient Navigator at [phone] during office hours should I have questions or need assistance in obtaining follow-up care.

## 2022-11-22 NOTE — Progress Notes (Signed)
START ON PATHWAY REGIMEN - Colorectal     A cycle is every 14 days:     Oxaliplatin      Leucovorin      Fluorouracil      Fluorouracil   **Always confirm dose/schedule in your pharmacy ordering system**  Patient Characteristics: Preoperative or Nonsurgical Candidate, M0 (Clinical Staging), Rectal, cT2, cN1 or cT3, cN0-1, and Not a Candidate for Sphincter-sparing Surgery or Neoadjuvant ChemoRT Preferred Tumor Location: Rectal Therapeutic Status: Preoperative or Nonsurgical Candidate, M0 (Clinical Staging) AJCC T Category: cT3 AJCC N Category: cN1 AJCC M Category: cM0 AJCC 8 Stage Grouping: IIIB Intent of Therapy: Curative Intent, Discussed with Patient 

## 2022-11-22 NOTE — Patient Instructions (Addendum)
[facility]  Discharge Instructions  You were seen and examined today by Dr. Ellin Saba. Dr. Ellin Saba is a medical oncologist, meaning that he specializes in the treatment of cancer diagnoses. Dr. Ellin Saba discussed your past medical history, family history of cancers, and the events that led to you being here today.  You have been diagnosed with Stage III Rectal Cancer.  This is typically treated with 4 months of chemotherapy via Port-A-Cath. Chemotherapy is given once every 2 weeks for 8 cycles.  This will be followed by oral chemotherapy (pills) with radiation therapy for approximately 6 weeks.   You will be referred to see a surgeon in [place]. You will also be referred for a Port-A-Cath placement.  Due to family history, you will also be referred for genetic counseling.  Follow-up as scheduled.  Thank you for choosing [facility] to provide your oncology and hematology care.   To afford each patient quality time with our provider, please arrive at least 15 minutes before your scheduled appointment time. You may need to reschedule your appointment if you arrive late (10 or more minutes). Arriving late affects you and other patients whose appointments are after yours.  Also, if you miss three or more appointments without notifying the office, you may be dismissed from the clinic at the provider's discretion.    Again, thank you for choosing [facility].  Our hope is that these requests will decrease the amount of time that you wait before being seen by our physicians.   If you have a lab appointment with the Cancer Center - please note that after April 8th, all labs will be drawn in the cancer center.  You do not have to check in or register with the main entrance as you have in the past but will complete your check-in at the cancer center.             _____________________________________________________________  Should you have questions after your visit to [facility], please contact our office at [phone] and follow the prompts.  Our office hours are 8:00 a.m. to 4:30 p.m. Monday - Thursday and 8:00 a.m. to 2:30 p.m. Friday.  Please note that voicemails left after 4:00 p.m. may not be returned until the following business day.  We are closed weekends and all major holidays.  You do have access to a nurse 24-7, just call the main number to the clinic [phone] and do not press any options, hold on the line and a nurse will answer the phone.    For prescription refill requests, have your pharmacy contact our office and allow 72 hours.    Masks are no longer required in the cancer centers. If you would like for your care team to wear a mask while they are taking care of you, please let them know. You may have one support person who is at least 46 years old accompany you for your appointments.

## 2022-11-23 ENCOUNTER — Other Ambulatory Visit: Payer: Self-pay

## 2022-11-24 ENCOUNTER — Other Ambulatory Visit: Payer: Self-pay | Admitting: Radiology

## 2022-11-24 ENCOUNTER — Inpatient Hospital Stay: Payer: 59 | Admitting: Licensed Clinical Social Worker

## 2022-11-24 ENCOUNTER — Inpatient Hospital Stay: Payer: 59

## 2022-11-24 DIAGNOSIS — C20 Malignant neoplasm of rectum: Secondary | ICD-10-CM

## 2022-11-24 MED ORDER — PROCHLORPERAZINE MALEATE 10 MG PO TABS
10.0000 mg | ORAL_TABLET | Freq: Four times a day (QID) | ORAL | 4 refills | Status: AC | PRN
Start: 2022-11-24 — End: ?

## 2022-11-24 MED ORDER — LIDOCAINE-PRILOCAINE 2.5-2.5 % EX CREA: TOPICAL_CREAM | CUTANEOUS | 3 refills | Status: AC

## 2022-11-24 NOTE — Consult Note (Signed)
Chief Complaint: Patient was seen in consultation today for Port-A-Cath placement  Referring Physician(s): Katragadda,Sreedhar  Supervising Physician: Oley Balm  Patient Status: [name]  History of Present Illness: Zachary Nelson is a 47 y.o. male smoker with history of newly diagnosed stage III rectal adenocarcinoma who presents today for Port-A-Cath placement to assist with treatment.   Past Surgical History:  Procedure Laterality Date   ANKLE FRACTURE SURGERY     FOOT SURGERY      Allergies: Bee venom  Medications: Prior to Admission medications   Medication Sig Start Date End Date Taking? Authorizing Provider  dextrose 5 % SOLN 1,000 mL with fluorouracil 5 GM/100ML SOLN Inject into the vein over 48 hr. Every 14 days 11/29/22   [provider]  FLUOROURACIL IV Inject into the vein every 14 (fourteen) days. 11/29/22   [provider]  LEUCOVORIN CALCIUM IV Inject into the vein every 14 (fourteen) days. 11/29/22   [provider]  lidocaine-prilocaine (EMLA) cream Apply a quarter-sized amount to port a cath site and cover with plastic wrap 1 hour prior to infusion appointments 11/24/22   Doreatha Massed, MD  OXALIPLATIN IV Inject into the vein every 14 (fourteen) days. 11/29/22   [provider]  prochlorperazine (COMPAZINE) 10 MG tablet Take 1 tablet (10 mg total) by mouth every 6 (six) hours as needed for nausea or vomiting. 11/24/22   Doreatha Massed, MD  traMADol (ULTRAM) 50 MG tablet Take 1 tablet (50 mg total) by mouth every 8 (eight) hours as needed. 11/22/22   Doreatha Massed, MD     No family history on file.  Social History   Socioeconomic History   Marital status: Divorced    Spouse name: Not on file   Number of children: Not on file   Years of education: Not on file   Highest education level: Not on file  Occupational History   Not on file  Tobacco Use   Smoking status: Every Day     Packs/day: 1    Types: Cigarettes   Smokeless tobacco: Never  Substance and Sexual Activity   Alcohol use: Yes   Drug use: Yes    Types: Marijuana   Sexual activity: Not on file  Other Topics Concern   Not on file  Social History Narrative   ** Merged History Encounter **       Social Determinants of Health   Financial Resource Strain: Not on file  Food Insecurity: No Food Insecurity (11/22/2022)   Hunger Vital Sign    Worried About Running Out of Food in the Last Year: Never true    Ran Out of Food in the Last Year: Never true  Transportation Needs: No Transportation Needs (11/22/2022)   PRAPARE - Administrator, Civil Service (Medical): No    Lack of Transportation (Non-Medical): No  Physical Activity: Not on file  Stress: Not on file  Social Connections: Not on file      Review of Systems denies fever, chest pain, dyspnea, cough, abdominal pain, nausea, vomiting.  He does have occasional back pain, rectal pain, intermittent blood in stool  Vital Signs: Vitals:   11/25/22 1145  BP: (!) 143/88  Pulse: 73  Resp: 20  Temp: 98.1 F (36.7 C)  SpO2: 99%       Code Status: FULL CODE  Advance care plan: Inquired with patient regarding advanced care plan and he currently has no plan in effect/documents on file  Physical Exam: Awake,  alert.  Chest clear to auscultation bilaterally.  Heart with regular rate and rhythm.  Abdomen soft, positive bowel sounds, nontender.  No lower extremity edema.  Imaging: No results found.  Labs:  CBC: No results for input(s): "WBC", "HGB", "HCT", "PLT" in the last 8760 hours.  COAGS: No results for input(s): "INR", "APTT" in the last 8760 hours.  BMP: No results for input(s): "NA", "K", "CL", "CO2", "GLUCOSE", "BUN", "CALCIUM", "CREATININE", "GFRNONAA", "GFRAA" in the last 8760 hours.  Invalid input(s): "CMP"  LIVER FUNCTION TESTS: No results for input(s): "BILITOT", "AST", "ALT", "ALKPHOS", "PROT", "ALBUMIN" in  the last 8760 hours.  TUMOR MARKERS: No results for input(s): "AFPTM", "CEA", "CA199", "CHROMGRNA" in the last 8760 hours.  Assessment and Plan: 47 y.o. male smoker with history of newly diagnosed stage III rectal adenocarcinoma who presents today for Port-A-Cath placement to assist with treatment.Risks and benefits of image guided port-a-catheter placement was discussed with the patient including, but not limited to bleeding, infection, pneumothorax, or fibrin sheath development and need for additional procedures.  All of the patient's questions were answered, patient is agreeable to proceed. Consent signed and in chart.    Thank you for this interesting consult.  I greatly enjoyed meeting Zachary Nelson and look forward to participating in their care.  A copy of this report was sent to the requesting provider on this date.  Electronically Signed: D. Jeananne Rama, PA-C 11/24/2022, 1:37 PM   I spent a total of  25 minutes   in face to face in clinical consultation, greater than 50% of which was counseling/coordinating care for port a cath placement

## 2022-11-24 NOTE — Progress Notes (Signed)

## 2022-11-25 ENCOUNTER — Encounter (HOSPITAL_COMMUNITY): Payer: Self-pay

## 2022-11-25 ENCOUNTER — Other Ambulatory Visit: Payer: Self-pay

## 2022-11-25 ENCOUNTER — Ambulatory Visit (HOSPITAL_COMMUNITY)
Admission: RE | Admit: 2022-11-25 | Discharge: 2022-11-25 | Disposition: A | Discharge status: Home / Self Care | Payer: 59 | Source: Ambulatory Visit

## 2022-11-25 ENCOUNTER — Ambulatory Visit (HOSPITAL_COMMUNITY)
Admission: RE | Admit: 2022-11-25 | Discharge: 2022-11-25 | Disposition: A | Discharge status: Home / Self Care | Payer: 59 | Source: Ambulatory Visit | Attending: Hematology | Admitting: Hematology

## 2022-11-25 DIAGNOSIS — C20 Malignant neoplasm of rectum: Secondary | ICD-10-CM | POA: Insufficient documentation

## 2022-11-25 HISTORY — PX: IR IMAGING GUIDED PORT INSERTION: IMG5740

## 2022-11-25 MED ORDER — MIDAZOLAM HCL 2 MG/2ML IJ SOLN
INTRAMUSCULAR | Status: AC | PRN
  Administered 2022-11-25 (×2): 1 mg via INTRAVENOUS

## 2022-11-25 MED ORDER — FENTANYL CITRATE (PF) 100 MCG/2ML IJ SOLN
INTRAMUSCULAR | Status: AC | PRN
  Administered 2022-11-25 (×2): 25 ug via INTRAVENOUS
  Administered 2022-11-25: 50 ug via INTRAVENOUS

## 2022-11-25 MED ORDER — LIDOCAINE-EPINEPHRINE 1 %-1:100000 IJ SOLN
20.0000 mL | Freq: Once | INTRAMUSCULAR | Status: AC
  Administered 2022-11-25: 10 mL via INTRADERMAL

## 2022-11-25 MED ORDER — SODIUM CHLORIDE 0.9 % IV SOLN: INTRAVENOUS | Status: DC

## 2022-11-25 MED ORDER — LIDOCAINE-EPINEPHRINE 1 %-1:100000 IJ SOLN
INTRAMUSCULAR | Status: AC
  Filled 2022-11-25: qty 1

## 2022-11-25 MED ORDER — MIDAZOLAM HCL 2 MG/2ML IJ SOLN
INTRAMUSCULAR | Status: AC
  Filled 2022-11-25: qty 2

## 2022-11-25 MED ORDER — HEPARIN SOD (PORK) LOCK FLUSH 100 UNIT/ML IV SOLN
INTRAVENOUS | Status: AC
  Filled 2022-11-25: qty 5

## 2022-11-25 MED ORDER — FENTANYL CITRATE (PF) 100 MCG/2ML IJ SOLN
INTRAMUSCULAR | Status: AC
  Filled 2022-11-25: qty 2

## 2022-11-25 MED ORDER — HEPARIN SOD (PORK) LOCK FLUSH 100 UNIT/ML IV SOLN
500.0000 [IU] | Freq: Once | INTRAVENOUS | Status: AC
  Administered 2022-11-25: 500 [IU] via INTRAVENOUS

## 2022-11-25 MED ORDER — LIDOCAINE-EPINEPHRINE 1 %-1:100000 IJ SOLN
20.0000 mL | Freq: Once | INTRAMUSCULAR | Status: AC
  Administered 2022-11-25: 20 mL via INTRADERMAL

## 2022-11-25 NOTE — Procedures (Signed)
  Procedure:  R IJ Port catheter placement   Preprocedure diagnosis: The encounter diagnosis was Rectal adenocarcinoma (HCC). Postprocedure diagnosis: same EBL:    minimal Complications:   none immediate  See full dictation in [employer].  Thora Lance MD Main # [phone] Pager  [phone] Mobile [phone]

## 2022-11-25 NOTE — Discharge Instructions (Signed)

## 2022-11-26 ENCOUNTER — Other Ambulatory Visit: Payer: Self-pay

## 2022-11-28 ENCOUNTER — Encounter: Payer: Self-pay | Admitting: Hematology

## 2022-11-28 ENCOUNTER — Other Ambulatory Visit: Payer: Self-pay

## 2022-11-28 ENCOUNTER — Telehealth: Payer: Self-pay | Admitting: Hematology

## 2022-11-28 NOTE — Progress Notes (Signed)
Pharmacist Chemotherapy Monitoring - Initial Assessment    Anticipated start date: 11/29/22   The following has been reviewed per standard work regarding the patient's treatment regimen: The patient's diagnosis, treatment plan and drug doses, and organ/hematologic function Lab orders and baseline tests specific to treatment regimen  The treatment plan start date, drug sequencing, and pre-medications Prior authorization status  Patient's documented medication list, including drug-drug interaction screen and prescriptions for anti-emetics and supportive care specific to the treatment regimen The drug concentrations, fluid compatibility, administration routes, and timing of the medications to be used The patient's access for treatment and lifetime cumulative dose history, if applicable  The patient's medication allergies and previous infusion related reactions, if applicable   Changes made to treatment plan:  N/A  Follow up needed:  N/A - Self pay   Stephens Shire, [facility], 11/28/2022  3:17 PM

## 2022-11-28 NOTE — Telephone Encounter (Signed)
Spoke with pts mother regarding the [employer]. Advised her of the program guidelines. Pt is currently unable to work and is being supported by his parents. Mrs Lysbeth Penner will provide a letter of support on 11/29/22.

## 2022-11-28 NOTE — Progress Notes (Signed)
[facility] Clinical Social Work  Initial Assessment   Zachary Nelson is a 47 y.o. year old male contacted by phone. Clinical Social Work was referred by medical provider for assessment of psychosocial needs.   SDOH (Social Determinants of Health) assessments performed: Yes   SDOH Screenings   Food Insecurity: No Food Insecurity (11/22/2022)  Housing: Low Risk  (11/22/2022)  Transportation Needs: No Transportation Needs (11/22/2022)  Utilities: Not At Risk (11/22/2022)  Depression (PHQ2-9): Low Risk  (11/22/2022)  Tobacco Use: Medium Risk (11/25/2022)     Distress Screen completed: No     No data to display            Family/Social Information:  Housing Arrangement: patient lives with his step mother and father.  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  Pt did not have family support in [place], which prompted his move to [facility].   Family members/support persons in your life? Pt's step mother and father are providing a place for pt to stay.  Pt's daughter is local and is providing assistance as she is able as well. Transportation concerns: no  Employment: Unemployed Pt was working as a Education administrator in [place], but needed to move closer to family for support during treatment.  Pt has applied and been approved for Medicaid, which pt states will be in affect as of June 1st.   Income source: Supported by [employer] and [employer] Financial concerns: Yes, current concerns Type of concern: Utilities, Rent/ mortgage, and Medical bills Food access concerns: no Religious or spiritual practice: Not known Services Currently in place:  none  Coping/ Adjustment to diagnosis: Patient understands treatment plan and what happens next? yes Concerns about diagnosis and/or treatment: Overwhelmed by information, How I will pay for the services I need, and How will I care for myself Patient reported stressors: Finances and Adjusting to my illness Hopes and/or priorities:  Pt's priority is to start treatment w/ the hope of positive results. Patient enjoys time with family/ friends Current coping skills/ strengths: Capable of independent living , Motivation for treatment/growth , and Supportive family/friends     SUMMARY: Current SDOH Barriers:  Financial constraints related to unemployment  Clinical Social Work Clinical Goal(s):  Explore community resource options for unmet needs related to:  Financial Strain   Interventions: Discussed common feeling and emotions when being diagnosed with cancer, and the importance of support during treatment Informed patient of the support team roles and support services at [facility] Provided CSW contact information and encouraged patient to call with any questions or concerns Referred patient to [employer].  Informed of the [employer] for which pt will apply.     Follow Up Plan: Patient will contact CSW with any support or resource needs Patient verbalizes understanding of plan: Yes    Rachel Moulds, LCSW Clinical Social Worker [facility]

## 2022-11-29 ENCOUNTER — Inpatient Hospital Stay: Payer: 59

## 2022-11-29 VITALS — BP 114/89 | HR 78 | Temp 97.8°F | Resp 18 | Ht 72.0 in | Wt 207.6 lb

## 2022-11-29 DIAGNOSIS — C20 Malignant neoplasm of rectum: Secondary | ICD-10-CM

## 2022-11-29 DIAGNOSIS — Z5111 Encounter for antineoplastic chemotherapy: Secondary | ICD-10-CM | POA: Diagnosis not present

## 2022-11-29 DIAGNOSIS — Z452 Encounter for adjustment and management of vascular access device: Secondary | ICD-10-CM | POA: Diagnosis not present

## 2022-11-29 DIAGNOSIS — Z95828 Presence of other vascular implants and grafts: Secondary | ICD-10-CM

## 2022-11-29 LAB — COMPREHENSIVE METABOLIC PANEL
ALT: 17 U/L (ref 0–44)
AST: 19 U/L (ref 15–41)
Albumin: 3.6 g/dL (ref 3.5–5.0)
Alkaline Phosphatase: 56 U/L (ref 38–126)
Anion gap: 4 — ABNORMAL LOW (ref 5–15)
BUN: 11 mg/dL (ref 6–20)
CO2: 25 mmol/L (ref 22–32)
Calcium: 8.4 mg/dL — ABNORMAL LOW (ref 8.9–10.3)
Chloride: 107 mmol/L (ref 98–111)
Creatinine, Ser: 1.37 mg/dL — ABNORMAL HIGH (ref 0.61–1.24)
GFR, Estimated: >60 mL/min (ref >60)
Glucose, Bld: 99 mg/dL (ref 70–99)
Potassium: 3.6 mmol/L (ref 3.5–5.1)
Sodium: 136 mmol/L (ref 135–145)
Total Bilirubin: 0.6 mg/dL (ref 0.3–1.2)
Total Protein: 7.2 g/dL (ref 6.5–8.1)

## 2022-11-29 LAB — MAGNESIUM: Magnesium: 1.9 mg/dL (ref 1.7–2.4)

## 2022-11-29 LAB — CBC WITH DIFFERENTIAL/PLATELET
Abs Immature Granulocytes: 0.03 10*3/uL (ref 0.00–0.07)
Basophils Absolute: 0 10*3/uL (ref 0.0–0.1)
Basophils Relative: 1 %
Eosinophils Absolute: 0.4 10*3/uL (ref 0.0–0.5)
Eosinophils Relative: 7 %
HCT: 39 % (ref 39.0–52.0)
Hemoglobin: 12.7 g/dL — ABNORMAL LOW (ref 13.0–17.0)
Immature Granulocytes: 1 %
Lymphocytes Relative: 31 %
Lymphs Abs: 1.8 10*3/uL (ref 0.7–4.0)
MCH: 28.7 pg (ref 26.0–34.0)
MCHC: 32.6 g/dL (ref 30.0–36.0)
MCV: 88 fL (ref 80.0–100.0)
Monocytes Absolute: 0.5 10*3/uL (ref 0.1–1.0)
Monocytes Relative: 9 %
Neutro Abs: 3 10*3/uL (ref 1.7–7.7)
Neutrophils Relative %: 51 %
Platelets: 263 10*3/uL (ref 150–400)
RBC: 4.43 MIL/uL (ref 4.22–5.81)
RDW: 12.4 % (ref 11.5–15.5)
WBC: 5.9 10*3/uL (ref 4.0–10.5)
nRBC: 0 % (ref 0.0–0.2)

## 2022-11-29 MED ORDER — FLUOROURACIL CHEMO INJECTION 2.5 GM/50ML
400.0000 mg/m2 | Freq: Once | INTRAVENOUS | Status: AC
  Administered 2022-11-29: 850 mg via INTRAVENOUS
  Filled 2022-11-29: qty 17

## 2022-11-29 MED ORDER — OXALIPLATIN CHEMO INJECTION 100 MG/20ML
85.0000 mg/m2 | Freq: Once | INTRAVENOUS | Status: AC
  Administered 2022-11-29: 200 mg via INTRAVENOUS
  Filled 2022-11-29: qty 40

## 2022-11-29 MED ORDER — SODIUM CHLORIDE 0.9 % IV SOLN
10.0000 mg | Freq: Once | INTRAVENOUS | Status: AC
  Administered 2022-11-29: 10 mg via INTRAVENOUS
  Filled 2022-11-29: qty 10

## 2022-11-29 MED ORDER — LEUCOVORIN CALCIUM INJECTION 350 MG
400.0000 mg/m2 | Freq: Once | INTRAVENOUS | Status: AC
  Administered 2022-11-29: 872 mg via INTRAVENOUS
  Filled 2022-11-29: qty 43.6

## 2022-11-29 MED ORDER — SODIUM CHLORIDE 0.9 % IV SOLN
2400.0000 mg/m2 | INTRAVENOUS | Status: DC
  Administered 2022-11-29: 5000 mg via INTRAVENOUS
  Filled 2022-11-29: qty 100

## 2022-11-29 MED ORDER — DEXTROSE 5 % IV SOLN: Freq: Once | INTRAVENOUS | Status: AC

## 2022-11-29 MED ORDER — SODIUM CHLORIDE 0.9% FLUSH
10.0000 mL | Freq: Once | INTRAVENOUS | Status: AC
  Administered 2022-11-29: 10 mL via INTRAVENOUS

## 2022-11-29 MED ORDER — PALONOSETRON HCL INJECTION 0.25 MG/5ML
0.2500 mg | Freq: Once | INTRAVENOUS | Status: AC
  Administered 2022-11-29: 0.25 mg via INTRAVENOUS
  Filled 2022-11-29: qty 5

## 2022-11-29 NOTE — Progress Notes (Signed)
Patient presents today for C1D1 Folfox infusion. Vital signs within parameters for treatment. Labs pending.   Labs within parameters for treatment.   Folfox given today per MD orders. Tolerated infusion without adverse affects. Vital signs stable. 5FU pump placed . No complaints at this time. Discharged from clinic ambulatory in stable condition. Alert and oriented x 3. F/U with [facility] as scheduled.

## 2022-11-29 NOTE — Patient Instructions (Signed)
[facility]  Discharge Instructions: Thank you for choosing [facility] to provide your oncology and hematology care.  If you have a lab appointment with the Cancer Center - please note that after April 8th, 2024, all labs will be drawn in the cancer center.  You do not have to check in or register with the main entrance as you have in the past but will complete your check-in in the cancer center.  Wear comfortable clothing and clothing appropriate for easy access to any Portacath or PICC line.   We strive to give you quality time with your provider. You may need to reschedule your appointment if you arrive late (15 or more minutes).  Arriving late affects you and other patients whose appointments are after yours.  Also, if you miss three or more appointments without notifying the office, you may be dismissed from the clinic at the provider's discretion.      For prescription refill requests, have your pharmacy contact our office and allow 72 hours for refills to be completed.    Today you received the following chemotherapy and/or immunotherapy agents Folfox.  Fluorouracil Injection What is this medication? FLUOROURACIL (flure oh YOOR a sil) treats some types of cancer. It works by slowing down the growth of cancer cells. This medicine may be used for other purposes; ask your health care provider or pharmacist if you have questions. COMMON BRAND NAME(S): Adrucil What should I tell my care team before I take this medication? They need to know if you have any of these conditions: Blood disorders Dihydropyrimidine dehydrogenase (DPD) deficiency Infection, such as chickenpox, cold sores, herpes Kidney disease Liver disease Poor nutrition Recent or ongoing radiation therapy An unusual or allergic reaction to fluorouracil, other medications, foods, dyes, or preservatives If you or your partner are pregnant or trying to get pregnant Breast-feeding How should  I use this medication? This medication is injected into a vein. It is administered by your care team in a hospital or clinic setting. Talk to your care team about the use of this medication in children. Special care may be needed. Overdosage: If you think you have taken too much of this medicine contact a poison control center or emergency room at once. NOTE: This medicine is only for you. Do not share this medicine with others. What if I miss a dose? Keep appointments for follow-up doses. It is important not to miss your dose. Call your care team if you are unable to keep an appointment. What may interact with this medication? Do not take this medication with any of the following: Live virus vaccines This medication may also interact with the following: Medications that treat or prevent blood clots, such as warfarin, enoxaparin, dalteparin This list may not describe all possible interactions. Give your health care provider a list of all the medicines, herbs, non-prescription drugs, or dietary supplements you use. Also tell them if you smoke, drink alcohol, or use illegal drugs. Some items may interact with your medicine. What should I watch for while using this medication? Your condition will be monitored carefully while you are receiving this medication. This medication may make you feel generally unwell. This is not uncommon as chemotherapy can affect healthy cells as well as cancer cells. Report any side effects. Continue your course of treatment even though you feel ill unless your care team tells you to stop. In some cases, you may be given additional medications to help with side effects. Follow all  directions for their use. This medication may increase your risk of getting an infection. Call your care team for advice if you get a fever, chills, sore throat, or other symptoms of a cold or flu. Do not treat yourself. Try to avoid being around people who are sick. This medication may increase  your risk to bruise or bleed. Call your care team if you notice any unusual bleeding. Be careful brushing or flossing your teeth or using a toothpick because you may get an infection or bleed more easily. If you have any dental work done, tell your dentist you are receiving this medication. Avoid taking medications that contain aspirin, acetaminophen, ibuprofen, naproxen, or ketoprofen unless instructed by your care team. These medications may hide a fever. Do not treat diarrhea with over the counter products. Contact your care team if you have diarrhea that lasts more than 2 days or if it is severe and watery. This medication can make you more sensitive to the sun. Keep out of the sun. If you cannot avoid being in the sun, wear protective clothing and sunscreen. Do not use sun lamps, tanning beds, or tanning booths. Talk to your care team if you or your partner wish to become pregnant or think you might be pregnant. This medication can cause serious birth defects if taken during pregnancy and for 3 months after the last dose. A reliable form of contraception is recommended while taking this medication and for 3 months after the last dose. Talk to your care team about effective forms of contraception. Do not father a child while taking this medication and for 3 months after the last dose. Use a condom while having sex during this time period. Do not breastfeed while taking this medication. This medication may cause infertility. Talk to your care team if you are concerned about your fertility. What side effects may I notice from receiving this medication? Side effects that you should report to your care team as soon as possible: Allergic reactions--skin rash, itching, hives, swelling of the face, lips, tongue, or throat Heart attack--pain or tightness in the chest, shoulders, arms, or jaw, nausea, shortness of breath, cold or clammy skin, feeling faint or lightheaded Heart failure--shortness of breath,  swelling of the ankles, feet, or hands, sudden weight gain, unusual weakness or fatigue Heart rhythm changes--fast or irregular heartbeat, dizziness, feeling faint or lightheaded, chest pain, trouble breathing High ammonia level--unusual weakness or fatigue, confusion, loss of appetite, nausea, vomiting, seizures Infection--fever, chills, cough, sore throat, wounds that don't heal, pain or trouble when passing urine, general feeling of discomfort or being unwell Low red blood cell level--unusual weakness or fatigue, dizziness, headache, trouble breathing Pain, tingling, or numbness in the hands or feet, muscle weakness, change in vision, confusion or trouble speaking, loss of balance or coordination, trouble walking, seizures Redness, swelling, and blistering of the skin over hands and feet Severe or prolonged diarrhea Unusual bruising or bleeding Side effects that usually do not require medical attention (report to your care team if they continue or are bothersome): Dry skin Headache Increased tears Nausea Pain, redness, or swelling with sores inside the mouth or throat Sensitivity to light Vomiting This list may not describe all possible side effects. Call your doctor for medical advice about side effects. You may report side effects to FDA at 1-800-FDA-1088. Where should I keep my medication? This medication is given in a hospital or clinic. It will not be stored at home. NOTE: This sheet is a summary. It may  not cover all possible information. If you have questions about this medicine, talk to your doctor, pharmacist, or health care provider.  2023 Elsevier/Gold Standard (2021-10-26 00:00:00) Leucovorin Injection What is this medication? LEUCOVORIN (loo koe VOR in) prevents side effects from certain medications, such as methotrexate. It works by increasing folate levels. This helps protect healthy cells in your body. It may also be used to treat anemia caused by low levels of folate. It  can also be used with fluorouracil, a type of chemotherapy, to treat colorectal cancer. It works by increasing the effects of fluorouracil in the body. This medicine may be used for other purposes; ask your health care provider or pharmacist if you have questions. What should I tell my care team before I take this medication? They need to know if you have any of these conditions: Anemia from low levels of vitamin B12 in the blood An unusual or allergic reaction to leucovorin, folic acid, other medications, foods, dyes, or preservatives Pregnant or trying to get pregnant Breastfeeding How should I use this medication? This medication is injected into a vein or a muscle. It is given by your care team in a hospital or clinic setting. Talk to your care team about the use of this medication in children. Special care may be needed. Overdosage: If you think you have taken too much of this medicine contact a poison control center or emergency room at once. NOTE: This medicine is only for you. Do not share this medicine with others. What if I miss a dose? Keep appointments for follow-up doses. It is important not to miss your dose. Call your care team if you are unable to keep an appointment. What may interact with this medication? Capecitabine Fluorouracil Phenobarbital Phenytoin Primidone Trimethoprim;sulfamethoxazole This list may not describe all possible interactions. Give your health care provider a list of all the medicines, herbs, non-prescription drugs, or dietary supplements you use. Also tell them if you smoke, drink alcohol, or use illegal drugs. Some items may interact with your medicine. What should I watch for while using this medication? Your condition will be monitored carefully while you are receiving this medication. This medication may increase the side effects of 5-fluorouracil. Tell your care team if you have diarrhea or mouth sores that do not get better or that get  worse. What side effects may I notice from receiving this medication? Side effects that you should report to your care team as soon as possible: Allergic reactions--skin rash, itching, hives, swelling of the face, lips, tongue, or throat This list may not describe all possible side effects. Call your doctor for medical advice about side effects. You may report side effects to FDA at 1-800-FDA-1088. Where should I keep my medication? This medication is given in a hospital or clinic. It will not be stored at home. NOTE: This sheet is a summary. It may not cover all possible information. If you have questions about this medicine, talk to your doctor, pharmacist, or health care provider.  2023 Elsevier/Gold Standard (2021-11-05 00:00:00) Oxaliplatin Injection What is this medication? OXALIPLATIN (ox AL i PLA tin) treats colorectal cancer. It works by slowing down the growth of cancer cells. This medicine may be used for other purposes; ask your health care provider or pharmacist if you have questions. COMMON BRAND NAME(S): Eloxatin What should I tell my care team before I take this medication? They need to know if you have any of these conditions: Heart disease History of irregular heartbeat or rhythm Liver  disease Low blood cell levels (white cells, red cells, and platelets) Lung or breathing disease, such as asthma Take medications that treat or prevent blood clots Tingling of the fingers, toes, or other nerve disorder An unusual or allergic reaction to oxaliplatin, other medications, foods, dyes, or preservatives If you or your partner are pregnant or trying to get pregnant Breast-feeding How should I use this medication? This medication is injected into a vein. It is given by your care team in a hospital or clinic setting. Talk to your care team about the use of this medication in children. Special care may be needed. Overdosage: If you think you have taken too much of this medicine  contact a poison control center or emergency room at once. NOTE: This medicine is only for you. Do not share this medicine with others. What if I miss a dose? Keep appointments for follow-up doses. It is important not to miss a dose. Call your care team if you are unable to keep an appointment. What may interact with this medication? Do not take this medication with any of the following: Cisapride Dronedarone Pimozide Thioridazine This medication may also interact with the following: Aspirin and aspirin-like medications Certain medications that treat or prevent blood clots, such as warfarin, apixaban, dabigatran, and rivaroxaban Cisplatin Cyclosporine Diuretics Medications for infection, such as acyclovir, adefovir, amphotericin B, bacitracin, cidofovir, foscarnet, ganciclovir, gentamicin, pentamidine, vancomycin NSAIDs, medications for pain and inflammation, such as ibuprofen or naproxen Other medications that cause heart rhythm changes Pamidronate Zoledronic acid This list may not describe all possible interactions. Give your health care provider a list of all the medicines, herbs, non-prescription drugs, or dietary supplements you use. Also tell them if you smoke, drink alcohol, or use illegal drugs. Some items may interact with your medicine. What should I watch for while using this medication? Your condition will be monitored carefully while you are receiving this medication. You may need blood work while taking this medication. This medication may make you feel generally unwell. This is not uncommon as chemotherapy can affect healthy cells as well as cancer cells. Report any side effects. Continue your course of treatment even though you feel ill unless your care team tells you to stop. This medication may increase your risk of getting an infection. Call your care team for advice if you get a fever, chills, sore throat, or other symptoms of a cold or flu. Do not treat yourself. Try to  avoid being around people who are sick. Avoid taking medications that contain aspirin, acetaminophen, ibuprofen, naproxen, or ketoprofen unless instructed by your care team. These medications may hide a fever. Be careful brushing or flossing your teeth or using a toothpick because you may get an infection or bleed more easily. If you have any dental work done, tell your dentist you are receiving this medication. This medication can make you more sensitive to cold. Do not drink cold drinks or use ice. Cover exposed skin before coming in contact with cold temperatures or cold objects. When out in cold weather wear warm clothing and cover your mouth and nose to warm the air that goes into your lungs. Tell your care team if you get sensitive to the cold. Talk to your care team if you or your partner are pregnant or think either of you might be pregnant. This medication can cause serious birth defects if taken during pregnancy and for 9 months after the last dose. A negative pregnancy test is required before starting this medication. A  reliable form of contraception is recommended while taking this medication and for 9 months after the last dose. Talk to your care team about effective forms of contraception. Do not father a child while taking this medication and for 6 months after the last dose. Use a condom while having sex during this time period. Do not breastfeed while taking this medication and for 3 months after the last dose. This medication may cause infertility. Talk to your care team if you are concerned about your fertility. What side effects may I notice from receiving this medication? Side effects that you should report to your care team as soon as possible: Allergic reactions--skin rash, itching, hives, swelling of the face, lips, tongue, or throat Bleeding--bloody or black, tar-like stools, vomiting blood or brown material that looks like coffee grounds, red or dark brown urine, small red or  purple spots on skin, unusual bruising or bleeding Dry cough, shortness of breath or trouble breathing Heart rhythm changes--fast or irregular heartbeat, dizziness, feeling faint or lightheaded, chest pain, trouble breathing Infection--fever, chills, cough, sore throat, wounds that don't heal, pain or trouble when passing urine, general feeling of discomfort or being unwell Liver injury--right upper belly pain, loss of appetite, nausea, light-colored stool, dark yellow or brown urine, yellowing skin or eyes, unusual weakness or fatigue Low red blood cell level--unusual weakness or fatigue, dizziness, headache, trouble breathing Muscle injury--unusual weakness or fatigue, muscle pain, dark yellow or brown urine, decrease in amount of urine Pain, tingling, or numbness in the hands or feet Sudden and severe headache, confusion, change in vision, seizures, which may be signs of posterior reversible encephalopathy syndrome (PRES) Unusual bruising or bleeding Side effects that usually do not require medical attention (report to your care team if they continue or are bothersome): Diarrhea Nausea Pain, redness, or swelling with sores inside the mouth or throat Unusual weakness or fatigue Vomiting This list may not describe all possible side effects. Call your doctor for medical advice about side effects. You may report side effects to FDA at 1-800-FDA-1088. Where should I keep my medication? This medication is given in a hospital or clinic. It will not be stored at home. NOTE: This sheet is a summary. It may not cover all possible information. If you have questions about this medicine, talk to your doctor, pharmacist, or health care provider.  2023 Elsevier/Gold Standard (2007-08-18 00:00:00)       To help prevent nausea and vomiting after your treatment, we encourage you to take your nausea medication as directed.  BELOW ARE SYMPTOMS THAT SHOULD BE REPORTED IMMEDIATELY: *FEVER GREATER THAN 100.4  F (38 C) OR HIGHER *CHILLS OR SWEATING *NAUSEA AND VOMITING THAT IS NOT CONTROLLED WITH YOUR NAUSEA MEDICATION *UNUSUAL SHORTNESS OF BREATH *UNUSUAL BRUISING OR BLEEDING *URINARY PROBLEMS (pain or burning when urinating, or frequent urination) *BOWEL PROBLEMS (unusual diarrhea, constipation, pain near the anus) TENDERNESS IN MOUTH AND THROAT WITH OR WITHOUT PRESENCE OF ULCERS (sore throat, sores in mouth, or a toothache) UNUSUAL RASH, SWELLING OR PAIN  UNUSUAL VAGINAL DISCHARGE OR ITCHING   Items with * indicate a potential emergency and should be followed up as soon as possible or go to the Emergency Department if any problems should occur.  Please show the CHEMOTHERAPY ALERT CARD or IMMUNOTHERAPY ALERT CARD at check-in to the Emergency Department and triage nurse.  Should you have questions after your visit or need to cancel or reschedule your appointment, please contact [facility] [phone]  and follow the  prompts.  Office hours are 8:00 a.m. to 4:30 p.m. Monday - Friday. Please note that voicemails left after 4:00 p.m. may not be returned until the following business day.  We are closed weekends and major holidays. You have access to a nurse at all times for urgent questions. Please call the main number to the clinic [phone] and follow the prompts.  For any non-urgent questions, you may also contact your provider using MyChart. We now offer e-Visits for anyone 61 and older to request care online for non-urgent symptoms. For details visit [email or web address].   Also download the MyChart app! Go to the app store, search "MyChart", open the app, select [facility], and log in with your MyChart username and password.

## 2022-12-01 ENCOUNTER — Other Ambulatory Visit: Payer: Self-pay | Admitting: Licensed Clinical Social Worker

## 2022-12-01 ENCOUNTER — Inpatient Hospital Stay: Payer: 59

## 2022-12-01 VITALS — BP 128/84 | HR 81 | Temp 96.8°F | Resp 18

## 2022-12-01 DIAGNOSIS — Z452 Encounter for adjustment and management of vascular access device: Secondary | ICD-10-CM | POA: Diagnosis not present

## 2022-12-01 DIAGNOSIS — Z5111 Encounter for antineoplastic chemotherapy: Secondary | ICD-10-CM | POA: Diagnosis not present

## 2022-12-01 DIAGNOSIS — C20 Malignant neoplasm of rectum: Secondary | ICD-10-CM

## 2022-12-01 LAB — CEA: CEA: 3.1 ng/mL (ref 0.0–4.7)

## 2022-12-01 MED ORDER — SODIUM CHLORIDE 0.9% FLUSH
10.0000 mL | INTRAVENOUS | Status: DC | PRN
  Administered 2022-12-01: 10 mL

## 2022-12-01 MED ORDER — HEPARIN SOD (PORK) LOCK FLUSH 100 UNIT/ML IV SOLN
500.0000 [IU] | Freq: Once | INTRAVENOUS | Status: AC | PRN
  Administered 2022-12-01: 500 [IU]

## 2022-12-01 NOTE — Progress Notes (Signed)
Patient presents today for pump d/c. Vital signs are stable. Port a cath site clean, dry, and intact. Port flushed with 10 mls of Normal Saline and 500 Units of Heparin. Needle removed intact. Band aid applied. Patient has no complaints at this time. Discharged from clinic ambulatory and in stable condition. Patient alert and oriented.  

## 2022-12-01 NOTE — Patient Instructions (Signed)
[facility]  Discharge Instructions: Thank you for choosing [facility] to provide your oncology and hematology care.  If you have a lab appointment with the Cancer Center - please note that after April 8th, 2024, all labs will be drawn in the cancer center.  You do not have to check in or register with the main entrance as you have in the past but will complete your check-in in the cancer center.  Wear comfortable clothing and clothing appropriate for easy access to any Portacath or PICC line.   We strive to give you quality time with your provider. You may need to reschedule your appointment if you arrive late (15 or more minutes).  Arriving late affects you and other patients whose appointments are after yours.  Also, if you miss three or more appointments without notifying the office, you may be dismissed from the clinic at the provider's discretion.      For prescription refill requests, have your pharmacy contact our office and allow 72 hours for refills to be completed.    Today you received the following chemotherapy and/or immunotherapy agents 5FU pump d/c. Fluorouracil Injection What is this medication? FLUOROURACIL (flure oh YOOR a sil) treats some types of cancer. It works by slowing down the growth of cancer cells. This medicine may be used for other purposes; ask your health care provider or pharmacist if you have questions. COMMON BRAND NAME(S): Adrucil What should I tell my care team before I take this medication? They need to know if you have any of these conditions: Blood disorders Dihydropyrimidine dehydrogenase (DPD) deficiency Infection, such as chickenpox, cold sores, herpes Kidney disease Liver disease Poor nutrition Recent or ongoing radiation therapy An unusual or allergic reaction to fluorouracil, other medications, foods, dyes, or preservatives If you or your partner are pregnant or trying to get pregnant Breast-feeding How  should I use this medication? This medication is injected into a vein. It is administered by your care team in a hospital or clinic setting. Talk to your care team about the use of this medication in children. Special care may be needed. Overdosage: If you think you have taken too much of this medicine contact a poison control center or emergency room at once. NOTE: This medicine is only for you. Do not share this medicine with others. What if I miss a dose? Keep appointments for follow-up doses. It is important not to miss your dose. Call your care team if you are unable to keep an appointment. What may interact with this medication? Do not take this medication with any of the following: Live virus vaccines This medication may also interact with the following: Medications that treat or prevent blood clots, such as warfarin, enoxaparin, dalteparin This list may not describe all possible interactions. Give your health care provider a list of all the medicines, herbs, non-prescription drugs, or dietary supplements you use. Also tell them if you smoke, drink alcohol, or use illegal drugs. Some items may interact with your medicine. What should I watch for while using this medication? Your condition will be monitored carefully while you are receiving this medication. This medication may make you feel generally unwell. This is not uncommon as chemotherapy can affect healthy cells as well as cancer cells. Report any side effects. Continue your course of treatment even though you feel ill unless your care team tells you to stop. In some cases, you may be given additional medications to help with side effects. Follow  all directions for their use. This medication may increase your risk of getting an infection. Call your care team for advice if you get a fever, chills, sore throat, or other symptoms of a cold or flu. Do not treat yourself. Try to avoid being around people who are sick. This medication may  increase your risk to bruise or bleed. Call your care team if you notice any unusual bleeding. Be careful brushing or flossing your teeth or using a toothpick because you may get an infection or bleed more easily. If you have any dental work done, tell your dentist you are receiving this medication. Avoid taking medications that contain aspirin, acetaminophen, ibuprofen, naproxen, or ketoprofen unless instructed by your care team. These medications may hide a fever. Do not treat diarrhea with over the counter products. Contact your care team if you have diarrhea that lasts more than 2 days or if it is severe and watery. This medication can make you more sensitive to the sun. Keep out of the sun. If you cannot avoid being in the sun, wear protective clothing and sunscreen. Do not use sun lamps, tanning beds, or tanning booths. Talk to your care team if you or your partner wish to become pregnant or think you might be pregnant. This medication can cause serious birth defects if taken during pregnancy and for 3 months after the last dose. A reliable form of contraception is recommended while taking this medication and for 3 months after the last dose. Talk to your care team about effective forms of contraception. Do not father a child while taking this medication and for 3 months after the last dose. Use a condom while having sex during this time period. Do not breastfeed while taking this medication. This medication may cause infertility. Talk to your care team if you are concerned about your fertility. What side effects may I notice from receiving this medication? Side effects that you should report to your care team as soon as possible: Allergic reactions--skin rash, itching, hives, swelling of the face, lips, tongue, or throat Heart attack--pain or tightness in the chest, shoulders, arms, or jaw, nausea, shortness of breath, cold or clammy skin, feeling faint or lightheaded Heart failure--shortness of  breath, swelling of the ankles, feet, or hands, sudden weight gain, unusual weakness or fatigue Heart rhythm changes--fast or irregular heartbeat, dizziness, feeling faint or lightheaded, chest pain, trouble breathing High ammonia level--unusual weakness or fatigue, confusion, loss of appetite, nausea, vomiting, seizures Infection--fever, chills, cough, sore throat, wounds that don't heal, pain or trouble when passing urine, general feeling of discomfort or being unwell Low red blood cell level--unusual weakness or fatigue, dizziness, headache, trouble breathing Pain, tingling, or numbness in the hands or feet, muscle weakness, change in vision, confusion or trouble speaking, loss of balance or coordination, trouble walking, seizures Redness, swelling, and blistering of the skin over hands and feet Severe or prolonged diarrhea Unusual bruising or bleeding Side effects that usually do not require medical attention (report to your care team if they continue or are bothersome): Dry skin Headache Increased tears Nausea Pain, redness, or swelling with sores inside the mouth or throat Sensitivity to light Vomiting This list may not describe all possible side effects. Call your doctor for medical advice about side effects. You may report side effects to FDA at 1-800-FDA-1088. Where should I keep my medication? This medication is given in a hospital or clinic. It will not be stored at home. NOTE: This sheet is a summary. It  may not cover all possible information. If you have questions about this medicine, talk to your doctor, pharmacist, or health care provider.  2024 Elsevier/Gold Standard (2021-11-02 00:00:00)       To help prevent nausea and vomiting after your treatment, we encourage you to take your nausea medication as directed.  BELOW ARE SYMPTOMS THAT SHOULD BE REPORTED IMMEDIATELY: *FEVER GREATER THAN 100.4 F (38 C) OR HIGHER *CHILLS OR SWEATING *NAUSEA AND VOMITING THAT IS NOT  CONTROLLED WITH YOUR NAUSEA MEDICATION *UNUSUAL SHORTNESS OF BREATH *UNUSUAL BRUISING OR BLEEDING *URINARY PROBLEMS (pain or burning when urinating, or frequent urination) *BOWEL PROBLEMS (unusual diarrhea, constipation, pain near the anus) TENDERNESS IN MOUTH AND THROAT WITH OR WITHOUT PRESENCE OF ULCERS (sore throat, sores in mouth, or a toothache) UNUSUAL RASH, SWELLING OR PAIN  UNUSUAL VAGINAL DISCHARGE OR ITCHING   Items with * indicate a potential emergency and should be followed up as soon as possible or go to the Emergency Department if any problems should occur.  Please show the CHEMOTHERAPY ALERT CARD or IMMUNOTHERAPY ALERT CARD at check-in to the Emergency Department and triage nurse.  Should you have questions after your visit or need to cancel or reschedule your appointment, please contact [facility] [phone]  and follow the prompts.  Office hours are 8:00 a.m. to 4:30 p.m. Monday - Friday. Please note that voicemails left after 4:00 p.m. may not be returned until the following business day.  We are closed weekends and major holidays. You have access to a nurse at all times for urgent questions. Please call the main number to the clinic [phone] and follow the prompts.  For any non-urgent questions, you may also contact your provider using MyChart. We now offer e-Visits for anyone 50 and older to request care online for non-urgent symptoms. For details visit [email or web address].   Also download the MyChart app! Go to the app store, search "MyChart", open the app, select [place], and log in with your MyChart username and password.

## 2022-12-02 ENCOUNTER — Telehealth: Payer: Self-pay | Admitting: *Deleted

## 2022-12-02 NOTE — Telephone Encounter (Signed)
24 hour chemotherapy follow-up call placed today. Pt did not answer the phone. VM was left stating for pt to call the clinic if needed.

## 2022-12-08 ENCOUNTER — Inpatient Hospital Stay: Payer: 59 | Admitting: Licensed Clinical Social Worker

## 2022-12-08 DIAGNOSIS — C20 Malignant neoplasm of rectum: Secondary | ICD-10-CM

## 2022-12-08 NOTE — Progress Notes (Signed)
[facility] CSW Progress Note  Visual merchandiser  spoke with pt regarding the process to apply for long term disability.  Pt's mother inquiring about [facility] referral as she has not heard from them yet.  CSW to email [facility] requesting call.  CSW to remain available as appropriate throughout duration of treatment.        Rachel Moulds, LCSW Clinical Social Worker [facility]

## 2022-12-09 ENCOUNTER — Encounter: Payer: Self-pay | Admitting: Hematology

## 2022-12-12 NOTE — Progress Notes (Signed)
[facility] [address] [place] [address]    Clinic Day:  12/13/2022  Referring physician: Marlise Eves, MD  Patient Care Team: Patient, No Pcp Per as PCP - General (General Practice) Doreatha Massed, MD as Medical Oncologist (Medical Oncology) Therese Sarah, RN as Oncology Nurse Navigator (Medical Oncology)   ASSESSMENT & PLAN:   Assessment: 1.  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 - Colonoscopy and biopsy (10/13/2022): Adenocarcinoma.  MMR preserved. - Evaluated at [facility] cancer care. - Pelvic MRI (10/21/2022): A semi-annular ulcerated T3 N1 low rectal tumor with mucinous features sitting at or just above the level of the sphincter complex without demonstrable infiltration of the sphincter complex.  Lymph node measures 5 mm in short axis with minimum distance 5 mm from MRF at 9:00.  Several additional small rounded lymph nodes seen in the vicinity which fall below the threshold.  No demonstrable extra mesorectal pelvic metastatic disease. - Cycle 1 of FOLFOX started on 11/29/2022   2.  Social/family history: - [name]  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  Quit smoking 6 months ago.  Smoked half to 1 pack/day starting at age 70. - Sister had stomach cancer.  Another sister had colon polyps removed.  Maternal aunt had lung cancer.  Maternal great grandfather had stomach cancer.  Paternal grandfather had bone cancer.  Paternal aunt had skin cancer.  Another paternal aunt had breast cancer.  Paternal uncle had lung cancer.    Plan: 1.  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 - He had some nausea but denied any vomiting.  Occasional tingling in the fingertips noted. - He will see  Dr. Maisie Fus on 12/19/2022. - Reviewed labs today: Creatinine is 1.35 and stable.  LFTs are normal.   CBC grossly normal with ANC of 1.6. - CEA level was 3.1. - Proceed with cycle 2 today without any dose modifications.  I will reevaluate him in 4 weeks. - Will follow-up on Signatera testing ordered at [facility] cancer care.  We have also ordered genetic testing.   2.  Rectal pain: - Rectal pain has improved after cycle 1.  Required only 2 tablets of tramadol after cycle 1 started.  He still has blood in the stools most of the time.    No orders of the defined types were placed in this encounter.     I,Katie Daubenspeck,acting as a Neurosurgeon for Doreatha Massed, MD.,have documented all relevant documentation on the behalf of Doreatha Massed, MD,as directed by  Doreatha Massed, MD while in the presence of Doreatha Massed, MD.   I, Doreatha Massed MD, have reviewed the above documentation for accuracy and completeness, and I agree with the above.   Doreatha Massed, MD   6/4/202411:13 AM  CHIEF COMPLAINT:   Diagnosis: rectal adenocarcinoma    Cancer Staging  Rectal adenocarcinoma [facility]) Staging form: Colon and Rectum, AJCC 8th Edition - Clinical stage from 11/18/2022: Stage IIIB (cT3, cN1b, cM0) - Unsigned    Prior Therapy: none  Current Therapy:  neoadjuvant FOLFOX   HISTORY OF PRESENT ILLNESS:   Oncology History  Rectal adenocarcinoma (HCC)  11/18/2022 Initial Diagnosis   Rectal adenocarcinoma (HCC)   11/29/2022 -  Chemotherapy   Patient is on Treatment Plan : COLORECTAL FOLFOX q14d x 4 months  INTERVAL HISTORY:   Decklan is a 47 y.o. male presenting to clinic today for follow up of rectal adenocarcinoma. He was last seen by me on 11/22/22 in consultation.  Since his last visit, he had port placed on 11/25/22.   Today, he states that he is doing well overall. His appetite level is at 100%. His energy level is at 100%.  PAST MEDICAL HISTORY:   Past Medical History: No past medical history on file.  Surgical History: Past Surgical  History:  Procedure Laterality Date   ANKLE FRACTURE SURGERY     FOOT SURGERY     IR IMAGING GUIDED PORT INSERTION  11/25/2022    Social History: Social History   Socioeconomic History   Marital status: Divorced    Spouse name: Not on file   Number of children: Not on file   Years of education: Not on file   Highest education level: Not on file  Occupational History   Not on file  Tobacco Use   Smoking status: Former    Types: Cigars    Quit date: 06/10/2022    Years since quitting: 0.5   Smokeless tobacco: Never  Vaping Use   Vaping Use: Some days  Substance and Sexual Activity   Alcohol use: Yes   Drug use: Not Currently   Sexual activity: Not on file  Other Topics Concern   Not on file  Social History Narrative   ** Merged History Encounter **       Social Determinants of Health   Financial Resource Strain: High Risk (11/28/2022)   Overall Financial Resource Strain (CARDIA)    Difficulty of Paying Living Expenses: Very hard  Food Insecurity: No Food Insecurity (11/22/2022)   Hunger Vital Sign    Worried About Running Out of Food in the Last Year: Never true    Ran Out of Food in the Last Year: Never true  Transportation Needs: No Transportation Needs (11/22/2022)   PRAPARE - Administrator, Civil Service (Medical): No    Lack of Transportation (Non-Medical): No  Physical Activity: Not on file  Stress: Not on file  Social Connections: Not on file  Intimate Partner Violence: Not At Risk (11/22/2022)   Humiliation, Afraid, Rape, and Kick questionnaire    Fear of Current or Ex-Partner: No    Emotionally Abused: No    Physically Abused: No    Sexually Abused: No    Family History: No family history on file.  Current Medications:  Current Outpatient Medications:    dextrose 5 % SOLN 1,000 mL with fluorouracil 5 GM/100ML SOLN, Inject into the vein over 48 hr. Every 14 days, Disp: , Rfl:    FLUOROURACIL IV, Inject into the vein every 14 (fourteen)  days., Disp: , Rfl:    LEUCOVORIN CALCIUM IV, Inject into the vein every 14 (fourteen) days., Disp: , Rfl:    lidocaine-prilocaine (EMLA) cream, Apply a quarter-sized amount to port a cath site and cover with plastic wrap 1 hour prior to infusion appointments, Disp: 30 g, Rfl: 3   OXALIPLATIN IV, Inject into the vein every 14 (fourteen) days., Disp: , Rfl:    prochlorperazine (COMPAZINE) 10 MG tablet, Take 1 tablet (10 mg total) by mouth every 6 (six) hours as needed for nausea or vomiting., Disp: 60 tablet, Rfl: 4   traMADol (ULTRAM) 50 MG tablet, Take 1 tablet (50 mg total) by mouth every 8 (eight) hours as needed., Disp: 90 tablet, Rfl: 0 No current facility-administered medications for  this visit.  Facility-Administered Medications Ordered in Other Visits:    fluorouracil (ADRUCIL) 5,000 mg in sodium chloride 0.9 % 150 mL chemo infusion, 2,400 mg/m2 (Treatment Plan Recorded), Intravenous, 1 day or 1 dose, Doreatha Massed, MD   fluorouracil (ADRUCIL) chemo injection 850 mg, 400 mg/m2 (Treatment Plan Recorded), Intravenous, Once, Doreatha Massed, MD   leucovorin 872 mg in dextrose 5 % 250 mL infusion, 400 mg/m2 (Treatment Plan Recorded), Intravenous, Once, Doreatha Massed, MD   oxaliplatin (ELOXATIN) 200 mg in dextrose 5 % 500 mL chemo infusion, 85 mg/m2 (Treatment Plan Recorded), Intravenous, Once, Doreatha Massed, MD   Allergies: Allergies  Allergen Reactions   Bee Venom     REVIEW OF SYSTEMS:   Review of Systems  Constitutional:  Negative for chills, fatigue and fever.  HENT:   Negative for lump/mass, mouth sores, nosebleeds, sore throat and trouble swallowing.   Eyes:  Negative for eye problems.  Respiratory:  Negative for cough and shortness of breath.   Cardiovascular:  Negative for chest pain, leg swelling and palpitations.  Gastrointestinal:  Positive for blood in stool. Negative for abdominal pain, constipation, diarrhea, nausea and vomiting.   Genitourinary:  Negative for bladder incontinence, difficulty urinating, dysuria, frequency, hematuria and nocturia.   Musculoskeletal:  Negative for arthralgias, back pain, flank pain, myalgias and neck pain.  Skin:  Negative for itching and rash.  Neurological:  Negative for dizziness, headaches and numbness.  Hematological:  Does not bruise/bleed easily.  Psychiatric/Behavioral:  Negative for depression, sleep disturbance and suicidal ideas. The patient is nervous/anxious.   All other systems reviewed and are negative.    VITALS:   There were no vitals taken for this visit.  Wt Readings from Last 3 Encounters:  12/13/22 207 lb 3.2 oz (94 kg)  11/29/22 207 lb 9.6 oz (94.2 kg)  11/25/22 206 lb (93.4 kg)    There is no height or weight on file to calculate BMI.  Performance status (ECOG): 1 - Symptomatic but completely ambulatory  PHYSICAL EXAM:   Physical Exam Vitals and nursing note reviewed. Exam conducted with a chaperone present.  Constitutional:      Appearance: Normal appearance.  Cardiovascular:     Rate and Rhythm: Normal rate and regular rhythm.     Pulses: Normal pulses.     Heart sounds: Normal heart sounds.  Pulmonary:     Effort: Pulmonary effort is normal.     Breath sounds: Normal breath sounds.  Abdominal:     Palpations: Abdomen is soft. There is no hepatomegaly, splenomegaly or mass.     Tenderness: There is no abdominal tenderness.  Musculoskeletal:     Right lower leg: No edema.     Left lower leg: No edema.  Lymphadenopathy:     Cervical: No cervical adenopathy.     Right cervical: No superficial, deep or posterior cervical adenopathy.    Left cervical: No superficial, deep or posterior cervical adenopathy.     Upper Body:     Right upper body: No supraclavicular or axillary adenopathy.     Left upper body: No supraclavicular or axillary adenopathy.  Neurological:     General: No focal deficit present.     Mental Status: He is alert and  oriented to person, place, and time.  Psychiatric:        Mood and Affect: Mood normal.        Behavior: Behavior normal.     LABS:      Latest Ref Rng & Units 12/13/2022  8:11 AM 11/29/2022    8:24 AM 05/22/2015    5:42 PM  CBC  WBC 4.0 - 10.5 K/uL 4.1  5.9  8.2   Hemoglobin 13.0 - 17.0 g/dL 64.4  03.4  74.2   Hematocrit 39.0 - 52.0 % 37.3  39.0  38.8   Platelets 150 - 400 K/uL 226  263  212       Latest Ref Rng & Units 12/13/2022    8:11 AM 11/29/2022    8:24 AM 05/22/2015    5:42 PM  CMP  Glucose 70 - 99 mg/dL 595  99  638   BUN 6 - 20 mg/dL 15  11  17    Creatinine 0.61 - 1.24 mg/dL 7.56  4.33  2.95   Sodium 135 - 145 mmol/L 134  136  138   Potassium 3.5 - 5.1 mmol/L 3.9  3.6  3.3   Chloride 98 - 111 mmol/L 103  107  104   CO2 22 - 32 mmol/L 25  25  26    Calcium 8.9 - 10.3 mg/dL 8.7  8.4  9.1   Total Protein 6.5 - 8.1 g/dL 7.0  7.2  7.5   Total Bilirubin 0.3 - 1.2 mg/dL 0.4  0.6  0.8   Alkaline Phos 38 - 126 U/L 61  56  45   AST 15 - 41 U/L 24  19  24    ALT 0 - 44 U/L 23  17  16       Lab Results  Component Value Date   CEA1 3.1 11/29/2022   /  CEA  Date Value Ref Range Status  11/29/2022 3.1 0.0 - 4.7 ng/mL Final    Comment:    (NOTE)                             Nonsmokers          <3.9                             Smokers             <5.6 Roche [employer] Electrochemiluminescence Immunoassay (ECLIA) Values obtained with different assay methods or kits cannot be used interchangeably.  Results cannot be interpreted as absolute evidence of the presence or absence of malignant disease. Performed At: [facility] [address] [place], [place] [id number] Jolene Schimke MD TK:[id number]    No results found for: "PSA1" No results found for: [phone]" No results found for: "CAN125"  No results found for: "TOTALPROTELP", "ALBUMINELP", "A1GS", "A2GS", "BETS", "BETA2SER", "GAMS", "MSPIKE", "SPEI" No results found for: "TIBC", "FERRITIN",  "IRONPCTSAT" No results found for: "LDH"   STUDIES:   IR IMAGING GUIDED PORT INSERTION  Result Date: 11/25/2022 CLINICAL DATA:  Rectal carcinoma, needs durable venous access for planned treatment regimen EXAM: TUNNELED PORT CATHETER PLACEMENT WITH ULTRASOUND AND FLUOROSCOPIC GUIDANCE FLUOROSCOPY: Radiation Exposure Index (as provided by the fluoroscopic device): 2 mGy air Kerma ANESTHESIA/SEDATION: Intravenous Fentanyl and Versed 2mg  were administered as conscious sedation during continuous monitoring of the patient's level of consciousness and physiological / cardiorespiratory status by the radiology RN, with a total moderate sedation time of 18 minutes. TECHNIQUE: The procedure, risks, benefits, and alternatives were explained to the patient. Questions regarding the procedure were encouraged and answered. The patient understands and consents to the procedure. Patency of the right IJ vein was confirmed with ultrasound with image documentation.  An appropriate skin site was determined. Skin site was marked. Region was prepped using maximum barrier technique including cap and mask, sterile gown, sterile gloves, large sterile sheet, and Chlorhexidine as cutaneous antisepsis. The region was infiltrated locally with 1% lidocaine. Under real-time ultrasound guidance, the right IJ vein was accessed with a 21 gauge micropuncture needle; the needle tip within the vein was confirmed with ultrasound image documentation. Needle was exchanged over a 018 guidewire for transitional dilator, and vascular measurement was performed. A small incision was made on the right anterior chest wall and a subcutaneous pocket fashioned. The power-injectable port was positioned and its catheter tunneled to the right IJ dermatotomy site. The transitional dilator was exchanged over an Amplatz wire for a peel-away sheath, through which the port catheter, which had been trimmed to the appropriate length, was advanced and positioned  under fluoroscopy with its tip at the cavoatrial junction. Spot chest radiograph confirms good catheter position and no pneumothorax. The port was flushed per protocol. The pocket was closed with deep interrupted and subcuticular continuous 3-0 Monocryl sutures. The incisions were covered with Dermabond then covered with a sterile dressing. The patient tolerated the procedure well. COMPLICATIONS: COMPLICATIONS None immediate IMPRESSION: Technically successful right IJ power-injectable port catheter placement. Ready for routine use. Electronically Signed   By: Corlis Leak M.D.   On: 11/25/2022 15:21

## 2022-12-13 ENCOUNTER — Inpatient Hospital Stay: Payer: 59

## 2022-12-13 ENCOUNTER — Inpatient Hospital Stay: Payer: 59 | Attending: Hematology

## 2022-12-13 ENCOUNTER — Inpatient Hospital Stay (HOSPITAL_BASED_OUTPATIENT_CLINIC_OR_DEPARTMENT_OTHER): Payer: 59 | Admitting: Hematology

## 2022-12-13 VITALS — BP 123/84 | HR 69 | Temp 96.4°F | Resp 20 | Wt 207.2 lb

## 2022-12-13 VITALS — BP 128/95 | HR 62 | Temp 96.7°F | Resp 18

## 2022-12-13 DIAGNOSIS — Z452 Encounter for adjustment and management of vascular access device: Secondary | ICD-10-CM | POA: Diagnosis not present

## 2022-12-13 DIAGNOSIS — Z95828 Presence of other vascular implants and grafts: Secondary | ICD-10-CM

## 2022-12-13 DIAGNOSIS — C20 Malignant neoplasm of rectum: Secondary | ICD-10-CM

## 2022-12-13 DIAGNOSIS — Z5111 Encounter for antineoplastic chemotherapy: Secondary | ICD-10-CM | POA: Diagnosis not present

## 2022-12-13 LAB — CBC WITH DIFFERENTIAL/PLATELET
Abs Immature Granulocytes: 0 10*3/uL (ref 0.00–0.07)
Basophils Absolute: 0 10*3/uL (ref 0.0–0.1)
Basophils Relative: 1 %
Eosinophils Absolute: 0.2 10*3/uL (ref 0.0–0.5)
Eosinophils Relative: 5 %
HCT: 37.3 % — ABNORMAL LOW (ref 39.0–52.0)
Hemoglobin: 12 g/dL — ABNORMAL LOW (ref 13.0–17.0)
Immature Granulocytes: 0 %
Lymphocytes Relative: 45 %
Lymphs Abs: 1.9 10*3/uL (ref 0.7–4.0)
MCH: 28.4 pg (ref 26.0–34.0)
MCHC: 32.2 g/dL (ref 30.0–36.0)
MCV: 88.4 fL (ref 80.0–100.0)
Monocytes Absolute: 0.4 10*3/uL (ref 0.1–1.0)
Monocytes Relative: 11 %
Neutro Abs: 1.6 10*3/uL — ABNORMAL LOW (ref 1.7–7.7)
Neutrophils Relative %: 38 %
Platelets: 226 10*3/uL (ref 150–400)
RBC: 4.22 MIL/uL (ref 4.22–5.81)
RDW: 12.5 % (ref 11.5–15.5)
WBC: 4.1 10*3/uL (ref 4.0–10.5)
nRBC: 0 % (ref 0.0–0.2)

## 2022-12-13 LAB — COMPREHENSIVE METABOLIC PANEL
ALT: 23 U/L (ref 0–44)
AST: 24 U/L (ref 15–41)
Albumin: 3.6 g/dL (ref 3.5–5.0)
Alkaline Phosphatase: 61 U/L (ref 38–126)
Anion gap: 6 (ref 5–15)
BUN: 15 mg/dL (ref 6–20)
CO2: 25 mmol/L (ref 22–32)
Calcium: 8.7 mg/dL — ABNORMAL LOW (ref 8.9–10.3)
Chloride: 103 mmol/L (ref 98–111)
Creatinine, Ser: 1.35 mg/dL — ABNORMAL HIGH (ref 0.61–1.24)
GFR, Estimated: >60 mL/min (ref >60)
Glucose, Bld: 115 mg/dL — ABNORMAL HIGH (ref 70–99)
Potassium: 3.9 mmol/L (ref 3.5–5.1)
Sodium: 134 mmol/L — ABNORMAL LOW (ref 135–145)
Total Bilirubin: 0.4 mg/dL (ref 0.3–1.2)
Total Protein: 7 g/dL (ref 6.5–8.1)

## 2022-12-13 LAB — MAGNESIUM: Magnesium: 1.9 mg/dL (ref 1.7–2.4)

## 2022-12-13 MED ORDER — SODIUM CHLORIDE 0.9 % IV SOLN
2400.0000 mg/m2 | INTRAVENOUS | Status: DC
  Administered 2022-12-13: 5000 mg via INTRAVENOUS
  Filled 2022-12-13: qty 100

## 2022-12-13 MED ORDER — SODIUM CHLORIDE 0.9% FLUSH
10.0000 mL | Freq: Once | INTRAVENOUS | Status: AC
  Administered 2022-12-13: 10 mL via INTRAVENOUS

## 2022-12-13 MED ORDER — SODIUM CHLORIDE 0.9 % IV SOLN
10.0000 mg | Freq: Once | INTRAVENOUS | Status: AC
  Administered 2022-12-13: 10 mg via INTRAVENOUS
  Filled 2022-12-13: qty 1

## 2022-12-13 MED ORDER — PALONOSETRON HCL INJECTION 0.25 MG/5ML
0.2500 mg | Freq: Once | INTRAVENOUS | Status: AC
  Administered 2022-12-13: 0.25 mg via INTRAVENOUS

## 2022-12-13 MED ORDER — FLUOROURACIL CHEMO INJECTION 2.5 GM/50ML
400.0000 mg/m2 | Freq: Once | INTRAVENOUS | Status: AC
  Administered 2022-12-13: 850 mg via INTRAVENOUS
  Filled 2022-12-13: qty 17

## 2022-12-13 MED ORDER — DEXTROSE 5 % IV SOLN: Freq: Once | INTRAVENOUS | Status: AC

## 2022-12-13 MED ORDER — LEUCOVORIN CALCIUM INJECTION 350 MG
400.0000 mg/m2 | Freq: Once | INTRAVENOUS | Status: AC
  Administered 2022-12-13: 872 mg via INTRAVENOUS
  Filled 2022-12-13 (×2): qty 43.6

## 2022-12-13 MED ORDER — OXALIPLATIN CHEMO INJECTION 100 MG/20ML
85.0000 mg/m2 | Freq: Once | INTRAVENOUS | Status: AC
  Administered 2022-12-13: 200 mg via INTRAVENOUS
  Filled 2022-12-13: qty 40

## 2022-12-13 NOTE — Patient Instructions (Signed)
[facility] Discharge Instructions   You were seen and examined today by Dr. Katragadda.  He reviewed the results of your lab work which are normal/stable.   We will proceed with your treatment today.  Return as scheduled.    Thank you for choosing [facility] at [facility] to provide your oncology and hematology care.  To afford each patient quality time with our provider, please arrive at least 15 minutes before your scheduled appointment time.   If you have a lab appointment with the Cancer Center please come in thru the Main Entrance and check in at the main information desk.  You need to re-schedule your appointment should you arrive 10 or more minutes late.  We strive to give you quality time with our providers, and arriving late affects you and other patients whose appointments are after yours.  Also, if you no show three or more times for appointments you may be dismissed from the clinic at the providers discretion.     Again, thank you for choosing [facility].  Our hope is that these requests will decrease the amount of time that you wait before being seen by our physicians.       _____________________________________________________________  Should you have questions after your visit to  Cancer Center, please contact our office at [phone] and follow the prompts.  Our office hours are 8:00 a.m. and 4:30 p.m. Monday - Friday.  Please note that voicemails left after 4:00 p.m. may not be returned until the following business day.  We are closed weekends and major holidays.  You do have access to a nurse 24-7, just call the main number to the clinic [phone] and do not press any options, hold on the line and a nurse will answer the phone.    For prescription refill requests, have your pharmacy contact our office and allow 72 hours.    Due to Covid, you will need to wear a mask upon entering  the hospital. If you do not have a mask, a mask will be given to you at the Main Entrance upon arrival. For doctor visits, patients may have 1 support person age 18 or older with them. For treatment visits, patients can not have anyone with them due to social distancing guidelines and our immunocompromised population.      

## 2022-12-13 NOTE — Progress Notes (Signed)
Patient is here today for D1C2 of Folfox.  He was seen by Dr. Ellin Saba and is okay to proceed with treatment. He reports that he has overall done very well with his previous treatment.

## 2022-12-13 NOTE — Progress Notes (Signed)
Folfox given today per MD orders. Tolerated infusion without adverse affects. Vital signs stable. No complaints at this time. Discharged from clinic ambulatory in stable condition. Alert and oriented x 3. F/U with [facility] as scheduled. 5FU ambulatory pump infusing.

## 2022-12-15 ENCOUNTER — Inpatient Hospital Stay: Payer: 59

## 2022-12-15 VITALS — BP 111/65 | HR 67 | Temp 96.2°F | Resp 20

## 2022-12-15 DIAGNOSIS — Z452 Encounter for adjustment and management of vascular access device: Secondary | ICD-10-CM | POA: Diagnosis not present

## 2022-12-15 DIAGNOSIS — C20 Malignant neoplasm of rectum: Secondary | ICD-10-CM | POA: Diagnosis not present

## 2022-12-15 DIAGNOSIS — Z5111 Encounter for antineoplastic chemotherapy: Secondary | ICD-10-CM | POA: Diagnosis not present

## 2022-12-15 MED ORDER — SODIUM CHLORIDE 0.9% FLUSH
10.0000 mL | INTRAVENOUS | Status: DC | PRN
  Administered 2022-12-15: 10 mL

## 2022-12-15 MED ORDER — HEPARIN SOD (PORK) LOCK FLUSH 100 UNIT/ML IV SOLN
500.0000 [IU] | Freq: Once | INTRAVENOUS | Status: AC | PRN
  Administered 2022-12-15: 500 [IU]

## 2022-12-15 NOTE — Progress Notes (Signed)
Patient presents today for pump d/c. Chemo pump disconnected. Port flushed with good blood return noted. No bruising or swelling at site. Bandaid applied and patient discharged in satisfactory condition. VVS stable with no signs or symptoms of distressed noted.

## 2022-12-15 NOTE — Patient Instructions (Signed)
[facility]  Discharge Instructions: Thank you for choosing [facility] to provide your oncology and hematology care.  If you have a lab appointment with the Cancer Center - please note that after April 8th, 2024, all labs will be drawn in the cancer center.  You do not have to check in or register with the main entrance as you have in the past but will complete your check-in in the cancer center.  Wear comfortable clothing and clothing appropriate for easy access to any Portacath or PICC line.   We strive to give you quality time with your provider. You may need to reschedule your appointment if you arrive late (15 or more minutes).  Arriving late affects you and other patients whose appointments are after yours.  Also, if you miss three or more appointments without notifying the office, you may be dismissed from the clinic at the provider's discretion.      For prescription refill requests, have your pharmacy contact our office and allow 72 hours for refills to be completed.    Today you received the following pump disconnected, return as scheduled.   To help prevent nausea and vomiting after your treatment, we encourage you to take your nausea medication as directed.  BELOW ARE SYMPTOMS THAT SHOULD BE REPORTED IMMEDIATELY: *FEVER GREATER THAN 100.4 F (38 C) OR HIGHER *CHILLS OR SWEATING *NAUSEA AND VOMITING THAT IS NOT CONTROLLED WITH YOUR NAUSEA MEDICATION *UNUSUAL SHORTNESS OF BREATH *UNUSUAL BRUISING OR BLEEDING *URINARY PROBLEMS (pain or burning when urinating, or frequent urination) *BOWEL PROBLEMS (unusual diarrhea, constipation, pain near the anus) TENDERNESS IN MOUTH AND THROAT WITH OR WITHOUT PRESENCE OF ULCERS (sore throat, sores in mouth, or a toothache) UNUSUAL RASH, SWELLING OR PAIN  UNUSUAL VAGINAL DISCHARGE OR ITCHING   Items with * indicate a potential emergency and should be followed up as soon as possible or go to the Emergency  Department if any problems should occur.  Please show the CHEMOTHERAPY ALERT CARD or IMMUNOTHERAPY ALERT CARD at check-in to the Emergency Department and triage nurse.  Should you have questions after your visit or need to cancel or reschedule your appointment, please contact [facility] [phone]  and follow the prompts.  Office hours are 8:00 a.m. to 4:30 p.m. Monday - Friday. Please note that voicemails left after 4:00 p.m. may not be returned until the following business day.  We are closed weekends and major holidays. You have access to a nurse at all times for urgent questions. Please call the main number to the clinic [phone] and follow the prompts.  For any non-urgent questions, you may also contact your provider using MyChart. We now offer e-Visits for anyone 29 and older to request care online for non-urgent symptoms. For details visit [email or web address].   Also download the MyChart app! Go to the app store, search "MyChart", open the app, select [facility], and log in with your MyChart username and password.

## 2022-12-16 DIAGNOSIS — C20 Malignant neoplasm of rectum: Secondary | ICD-10-CM | POA: Diagnosis not present

## 2022-12-19 ENCOUNTER — Inpatient Hospital Stay: Payer: 59 | Admitting: Dietician

## 2022-12-19 ENCOUNTER — Telehealth: Payer: Self-pay | Admitting: Dietician

## 2022-12-19 DIAGNOSIS — C20 Malignant neoplasm of rectum: Secondary | ICD-10-CM | POA: Diagnosis not present

## 2022-12-19 NOTE — Telephone Encounter (Signed)
Attempted to contact patient via telephone number requested per appointment note [phone]) for scheduled nutrition visit. Patient did not answer. Unable to leave message as VM box is full. Will continue efforts to reach patient as able.

## 2022-12-26 ENCOUNTER — Inpatient Hospital Stay: Payer: 59 | Admitting: Hematology

## 2022-12-26 ENCOUNTER — Inpatient Hospital Stay: Payer: 59

## 2022-12-26 ENCOUNTER — Inpatient Hospital Stay: Payer: 59 | Admitting: Dietician

## 2022-12-26 VITALS — BP 125/88 | HR 67 | Temp 97.4°F | Resp 18 | Ht 71.0 in | Wt 202.6 lb

## 2022-12-26 VITALS — BP 136/85 | HR 61 | Temp 97.0°F | Resp 16

## 2022-12-26 DIAGNOSIS — Z95828 Presence of other vascular implants and grafts: Secondary | ICD-10-CM

## 2022-12-26 DIAGNOSIS — Z452 Encounter for adjustment and management of vascular access device: Secondary | ICD-10-CM | POA: Diagnosis not present

## 2022-12-26 DIAGNOSIS — C20 Malignant neoplasm of rectum: Secondary | ICD-10-CM | POA: Diagnosis not present

## 2022-12-26 DIAGNOSIS — Z5111 Encounter for antineoplastic chemotherapy: Secondary | ICD-10-CM | POA: Diagnosis not present

## 2022-12-26 LAB — COMPREHENSIVE METABOLIC PANEL
ALT: 20 U/L (ref 0–44)
AST: 25 U/L (ref 15–41)
Albumin: 3.7 g/dL (ref 3.5–5.0)
Alkaline Phosphatase: 57 U/L (ref 38–126)
Anion gap: 6 (ref 5–15)
BUN: 13 mg/dL (ref 6–20)
CO2: 25 mmol/L (ref 22–32)
Calcium: 8.5 mg/dL — ABNORMAL LOW (ref 8.9–10.3)
Chloride: 106 mmol/L (ref 98–111)
Creatinine, Ser: 1.29 mg/dL — ABNORMAL HIGH (ref 0.61–1.24)
GFR, Estimated: >60 mL/min (ref >60)
Glucose, Bld: 106 mg/dL — ABNORMAL HIGH (ref 70–99)
Potassium: 3.5 mmol/L (ref 3.5–5.1)
Sodium: 137 mmol/L (ref 135–145)
Total Bilirubin: 0.6 mg/dL (ref 0.3–1.2)
Total Protein: 6.9 g/dL (ref 6.5–8.1)

## 2022-12-26 LAB — CBC WITH DIFFERENTIAL/PLATELET
Abs Immature Granulocytes: 0.01 10*3/uL (ref 0.00–0.07)
Basophils Absolute: 0 10*3/uL (ref 0.0–0.1)
Basophils Relative: 1 %
Eosinophils Absolute: 0.1 10*3/uL (ref 0.0–0.5)
Eosinophils Relative: 3 %
HCT: 34.3 % — ABNORMAL LOW (ref 39.0–52.0)
Hemoglobin: 11.4 g/dL — ABNORMAL LOW (ref 13.0–17.0)
Immature Granulocytes: 0 %
Lymphocytes Relative: 41 %
Lymphs Abs: 1.3 10*3/uL (ref 0.7–4.0)
MCH: 28.8 pg (ref 26.0–34.0)
MCHC: 33.2 g/dL (ref 30.0–36.0)
MCV: 86.6 fL (ref 80.0–100.0)
Monocytes Absolute: 0.4 10*3/uL (ref 0.1–1.0)
Monocytes Relative: 13 %
Neutro Abs: 1.4 10*3/uL — ABNORMAL LOW (ref 1.7–7.7)
Neutrophils Relative %: 42 %
Platelets: 131 10*3/uL — ABNORMAL LOW (ref 150–400)
RBC: 3.96 MIL/uL — ABNORMAL LOW (ref 4.22–5.81)
RDW: 13.5 % (ref 11.5–15.5)
WBC: 3.3 10*3/uL — ABNORMAL LOW (ref 4.0–10.5)
nRBC: 0 % (ref 0.0–0.2)

## 2022-12-26 LAB — MAGNESIUM: Magnesium: 1.8 mg/dL (ref 1.7–2.4)

## 2022-12-26 MED ORDER — SODIUM CHLORIDE 0.9% FLUSH
10.0000 mL | INTRAVENOUS | Status: DC | PRN
  Administered 2022-12-26: 10 mL via INTRAVENOUS

## 2022-12-26 MED ORDER — ALUM & MAG HYDROXIDE-SIMETH 200-200-20 MG/5ML PO SUSP: 15.0000 mL | Freq: Four times a day (QID) | ORAL | 0 refills | Status: DC | PRN

## 2022-12-26 MED ORDER — FLUOROURACIL CHEMO INJECTION 2.5 GM/50ML
400.0000 mg/m2 | Freq: Once | INTRAVENOUS | Status: AC
  Administered 2022-12-26: 850 mg via INTRAVENOUS
  Filled 2022-12-26: qty 17

## 2022-12-26 MED ORDER — SODIUM CHLORIDE 0.9 % IV SOLN
10.0000 mg | Freq: Once | INTRAVENOUS | Status: AC
  Administered 2022-12-26: 10 mg via INTRAVENOUS
  Filled 2022-12-26: qty 10

## 2022-12-26 MED ORDER — DEXTROSE 5 % IV SOLN: Freq: Once | INTRAVENOUS | Status: AC

## 2022-12-26 MED ORDER — SODIUM CHLORIDE 0.9 % IV SOLN
2400.0000 mg/m2 | INTRAVENOUS | Status: DC
  Administered 2022-12-26: 5000 mg via INTRAVENOUS
  Filled 2022-12-26: qty 100

## 2022-12-26 MED ORDER — ALUM & MAG HYDROXIDE-SIMETH 200-200-20 MG/5ML PO SUSP: 10.0000 mL | Freq: Four times a day (QID) | ORAL | 1 refills | Status: AC | PRN

## 2022-12-26 MED ORDER — LEUCOVORIN CALCIUM INJECTION 350 MG
400.0000 mg/m2 | Freq: Once | INTRAVENOUS | Status: AC
  Administered 2022-12-26: 872 mg via INTRAVENOUS
  Filled 2022-12-26: qty 43.6

## 2022-12-26 MED ORDER — OXALIPLATIN CHEMO INJECTION 100 MG/20ML
85.0000 mg/m2 | Freq: Once | INTRAVENOUS | Status: AC
  Administered 2022-12-26: 200 mg via INTRAVENOUS
  Filled 2022-12-26: qty 40

## 2022-12-26 MED ORDER — LIDOCAINE VISCOUS HCL 2 % MT SOLN: 15.0000 mL | OROMUCOSAL | 1 refills | Status: AC | PRN

## 2022-12-26 MED ORDER — PALONOSETRON HCL INJECTION 0.25 MG/5ML
0.2500 mg | Freq: Once | INTRAVENOUS | Status: AC
  Administered 2022-12-26: 0.25 mg via INTRAVENOUS
  Filled 2022-12-26: qty 5

## 2022-12-26 NOTE — Progress Notes (Signed)
Patients port flushed without difficulty.  Good blood return noted with no bruising or swelling noted at site.  VSS. Port remains accessed for treatment.

## 2022-12-26 NOTE — Progress Notes (Signed)
Labs reviewed today with MD. Rip Harbour to treat with ANC 1.4  per MD. Patient complaining of ulcers in the roof of his mouth. Denies trouble swallowing. No SOB. MD made aware and will send in mouth rinse and spit per MD orders.    Upon discharge and completion of chemotherapy treatment today. Patient stated he forgot to tell nurse that when he puts cold drink in his mouth it burns, Nurse reviewed side effects of oxaliplatin again. Patient also stated when he puts food or drink that he burns or feels like his lymph node area is on fire when he chews. Told patient nurse would call tomorrow after he gets his new medication for the mouth sores started, hoping that would help. Nurse will follow up and notify MD if needed.   Treatment given per orders. Patient tolerated it well without problems. Vitals stable and discharged home from clinic ambulatory. Follow up as scheduled.

## 2022-12-26 NOTE — Progress Notes (Signed)
Nutrition Assessment   Reason for Assessment: Referral (rectal cancer)   ASSESSMENT: 47 year old male with stage III rectal cancer. He is receiving neoadjuvant Folfox q14 (first 5/21). Patient is under the care of Dr. Ellin Saba.   No significant past medical history   Met with patient in infusion. He reports mild cold sensitivity. Says he notices it most with touch. Patient says he gets a whole body chill if eating/drinking cold items. He is tolerating room temperature without difficulty. Patient reports decreased appetite after therapy. This improves with pump disconnect. He is eating breakfast and supper meals. Patient will snack in the afternoon. Recalls bowl of oatmeal, eggs, bacon for breakfast. He has intermittent nausea. This is well controlled with antiemetics. He reports single mouth sore on the roof of mouth. Patient is having more regular bowel movements. He denies blood in stool.    Nutrition Focused Physical Exam: deferred    Medications: compazine, tramadol, xylocaine   Labs: Cr 1.29   Anthropometrics: Weights have decreased 2% (5 lbs) in 2 weeks - significant  Height: 5'11" Weight: 202 lb 9.6 oz  UBW: 205-210 lb  BMI: 28.26    NUTRITION DIAGNOSIS: Food and nutrition related knowledge deficit related to cancer as evidenced by no prior need for associated nutrition information   INTERVENTION:  Educated on importance of adequate calorie and protein energy intake to maintain weights/strength during treatment Discussed foods with protein, encouraged protein source with all meals/snacks - handout with snack ideas provided Discussed strategies for mouth sores, encouraged baking soda salt water rinses several times daily + soft moist high protein foods to support healing  - handout with recipe/tips provided Patient will work to increase intake of water Contact information provided    MONITORING, EVALUATION, GOAL: Pt will tolerate   Next Visit: To be scheduled as  needed

## 2022-12-26 NOTE — Patient Instructions (Signed)
[facility]  Discharge Instructions: Thank you for choosing [facility] to provide your oncology and hematology care.  If you have a lab appointment with the Cancer Center - please note that after April 8th, 2024, all labs will be drawn in the cancer center.  You do not have to check in or register with the main entrance as you have in the past but will complete your check-in in the cancer center.  Wear comfortable clothing and clothing appropriate for easy access to any Portacath or PICC line.   We strive to give you quality time with your provider. You may need to reschedule your appointment if you arrive late (15 or more minutes).  Arriving late affects you and other patients whose appointments are after yours.  Also, if you miss three or more appointments without notifying the office, you may be dismissed from the clinic at the provider's discretion.      For prescription refill requests, have your pharmacy contact our office and allow 72 hours for refills to be completed.    Today you received the following chemotherapy and/or immunotherapy agents oxaliplatin, leucovorin, flourarouracil   To help prevent nausea and vomiting after your treatment, we encourage you to take your nausea medication as directed.  BELOW ARE SYMPTOMS THAT SHOULD BE REPORTED IMMEDIATELY: *FEVER GREATER THAN 100.4 F (38 C) OR HIGHER *CHILLS OR SWEATING *NAUSEA AND VOMITING THAT IS NOT CONTROLLED WITH YOUR NAUSEA MEDICATION *UNUSUAL SHORTNESS OF BREATH *UNUSUAL BRUISING OR BLEEDING *URINARY PROBLEMS (pain or burning when urinating, or frequent urination) *BOWEL PROBLEMS (unusual diarrhea, constipation, pain near the anus) TENDERNESS IN MOUTH AND THROAT WITH OR WITHOUT PRESENCE OF ULCERS (sore throat, sores in mouth, or a toothache) UNUSUAL RASH, SWELLING OR PAIN  UNUSUAL VAGINAL DISCHARGE OR ITCHING   Items with * indicate a potential emergency and should be followed up as soon  as possible or go to the Emergency Department if any problems should occur.  Please show the CHEMOTHERAPY ALERT CARD or IMMUNOTHERAPY ALERT CARD at check-in to the Emergency Department and triage nurse.  Should you have questions after your visit or need to cancel or reschedule your appointment, please contact [facility] [phone]  and follow the prompts.  Office hours are 8:00 a.m. to 4:30 p.m. Monday - Friday. Please note that voicemails left after 4:00 p.m. may not be returned until the following business day.  We are closed weekends and major holidays. You have access to a nurse at all times for urgent questions. Please call the main number to the clinic [phone] and follow the prompts.  For any non-urgent questions, you may also contact your provider using MyChart. We now offer e-Visits for anyone 34 and older to request care online for non-urgent symptoms. For details visit [email or web address].   Also download the MyChart app! Go to the app store, search "MyChart", open the app, select [place], and log in with your MyChart username and password.

## 2022-12-27 ENCOUNTER — Inpatient Hospital Stay: Payer: 59

## 2022-12-27 ENCOUNTER — Other Ambulatory Visit: Payer: Self-pay

## 2022-12-27 ENCOUNTER — Encounter: Payer: Self-pay | Admitting: Hematology

## 2022-12-27 ENCOUNTER — Other Ambulatory Visit: Payer: Self-pay | Admitting: Genetic Counselor

## 2022-12-27 ENCOUNTER — Inpatient Hospital Stay (HOSPITAL_BASED_OUTPATIENT_CLINIC_OR_DEPARTMENT_OTHER): Payer: 59 | Admitting: Genetic Counselor

## 2022-12-27 DIAGNOSIS — Z803 Family history of malignant neoplasm of breast: Secondary | ICD-10-CM | POA: Diagnosis not present

## 2022-12-27 DIAGNOSIS — C20 Malignant neoplasm of rectum: Secondary | ICD-10-CM

## 2022-12-27 DIAGNOSIS — Z800 Family history of malignant neoplasm of digestive organs: Secondary | ICD-10-CM

## 2022-12-27 DIAGNOSIS — Z808 Family history of malignant neoplasm of other organs or systems: Secondary | ICD-10-CM | POA: Diagnosis not present

## 2022-12-27 LAB — GENETIC SCREENING ORDER

## 2022-12-28 ENCOUNTER — Inpatient Hospital Stay: Payer: 59

## 2022-12-28 VITALS — BP 127/80 | HR 72 | Temp 97.1°F | Resp 20

## 2022-12-28 DIAGNOSIS — C20 Malignant neoplasm of rectum: Secondary | ICD-10-CM | POA: Diagnosis not present

## 2022-12-28 DIAGNOSIS — Z5111 Encounter for antineoplastic chemotherapy: Secondary | ICD-10-CM | POA: Diagnosis not present

## 2022-12-28 DIAGNOSIS — Z452 Encounter for adjustment and management of vascular access device: Secondary | ICD-10-CM | POA: Diagnosis not present

## 2022-12-28 MED ORDER — SODIUM CHLORIDE 0.9% FLUSH
10.0000 mL | INTRAVENOUS | Status: DC | PRN
  Administered 2022-12-28: 10 mL

## 2022-12-28 MED ORDER — HEPARIN SOD (PORK) LOCK FLUSH 100 UNIT/ML IV SOLN
500.0000 [IU] | Freq: Once | INTRAVENOUS | Status: AC | PRN
  Administered 2022-12-28: 500 [IU]

## 2022-12-28 NOTE — Progress Notes (Signed)
Zachary Nelson presents to have home infusion pump d/c'd and for port-a-cath deaccess with flush.  Portacath located right chest wall accessed with  H 20 needle.  Good blood return present. Portacath flushed with NS and 500U/68ml Heparin, and needle removed intact.  Procedure tolerated well and without incident.  Patient states his mouth sores and throat is feeling much better since he started the maalox/lidocaine medication.   Vitals stable and discharged home from clinic ambulatory. Follow up as scheduled.

## 2022-12-28 NOTE — Patient Instructions (Signed)
[facility]  Discharge Instructions: Thank you for choosing [facility] to provide your oncology and hematology care.  If you have a lab appointment with the Cancer Center - please note that after April 8th, 2024, all labs will be drawn in the cancer center.  You do not have to check in or register with the main entrance as you have in the past but will complete your check-in in the cancer center.  Wear comfortable clothing and clothing appropriate for easy access to any Portacath or PICC line.   We strive to give you quality time with your provider. You may need to reschedule your appointment if you arrive late (15 or more minutes).  Arriving late affects you and other patients whose appointments are after yours.  Also, if you miss three or more appointments without notifying the office, you may be dismissed from the clinic at the provider's discretion.      For prescription refill requests, have your pharmacy contact our office and allow 72 hours for refills to be completed.    Today your ambulatory pump was disconnected per protocol   To help prevent nausea and vomiting after your treatment, we encourage you to take your nausea medication as directed.  BELOW ARE SYMPTOMS THAT SHOULD BE REPORTED IMMEDIATELY: *FEVER GREATER THAN 100.4 F (38 C) OR HIGHER *CHILLS OR SWEATING *NAUSEA AND VOMITING THAT IS NOT CONTROLLED WITH YOUR NAUSEA MEDICATION *UNUSUAL SHORTNESS OF BREATH *UNUSUAL BRUISING OR BLEEDING *URINARY PROBLEMS (pain or burning when urinating, or frequent urination) *BOWEL PROBLEMS (unusual diarrhea, constipation, pain near the anus) TENDERNESS IN MOUTH AND THROAT WITH OR WITHOUT PRESENCE OF ULCERS (sore throat, sores in mouth, or a toothache) UNUSUAL RASH, SWELLING OR PAIN  UNUSUAL VAGINAL DISCHARGE OR ITCHING   Items with * indicate a potential emergency and should be followed up as soon as possible or go to the Emergency Department if any  problems should occur.  Please show the CHEMOTHERAPY ALERT CARD or IMMUNOTHERAPY ALERT CARD at check-in to the Emergency Department and triage nurse.  Should you have questions after your visit or need to cancel or reschedule your appointment, please contact [facility] [phone]  and follow the prompts.  Office hours are 8:00 a.m. to 4:30 p.m. Monday - Friday. Please note that voicemails left after 4:00 p.m. may not be returned until the following business day.  We are closed weekends and major holidays. You have access to a nurse at all times for urgent questions. Please call the main number to the clinic [phone] and follow the prompts.  For any non-urgent questions, you may also contact your provider using MyChart. We now offer e-Visits for anyone 56 and older to request care online for non-urgent symptoms. For details visit [email or web address].   Also download the MyChart app! Go to the app store, search "MyChart", open the app, select [place], and log in with your MyChart username and password.

## 2022-12-29 ENCOUNTER — Encounter: Payer: Self-pay | Admitting: Hematology

## 2022-12-29 ENCOUNTER — Encounter: Payer: Self-pay | Admitting: Genetic Counselor

## 2022-12-29 DIAGNOSIS — Z803 Family history of malignant neoplasm of breast: Secondary | ICD-10-CM | POA: Insufficient documentation

## 2022-12-29 DIAGNOSIS — Z800 Family history of malignant neoplasm of digestive organs: Secondary | ICD-10-CM | POA: Insufficient documentation

## 2022-12-29 NOTE — Progress Notes (Addendum)
REFERRING PROVIDER: Doreatha Massed, MD [address] [place],  [place] [address]  PRIMARY PROVIDER:  Patient, No Pcp Per  PRIMARY REASON FOR VISIT:  1. Family history of stomach cancer   2. Family history of breast cancer   3. Rectal adenocarcinoma (HCC)      HISTORY OF PRESENT ILLNESS:   [name], a 47 y.o. male, was seen for a [place] cancer genetics consultation at the request of Dr. Ellin Saba due to a personal and family history of rectal cancer.  [name] presents to clinic today to discuss the possibility of a hereditary predisposition to cancer, genetic testing, and to further clarify his future cancer risks, as well as potential cancer risks for family members.   In May 2024, at the age of 63, [name] was diagnosed with rectal cancer. The treatment plan currently is chemotherapy.    CANCER HISTORY:  Oncology History  Rectal adenocarcinoma (HCC)  11/18/2022 Initial Diagnosis   Rectal adenocarcinoma (HCC)   11/29/2022 -  Chemotherapy   Patient is on Treatment Plan : COLORECTAL FOLFOX q14d x 4 months       Past Medical History:  Diagnosis Date   Family history of breast cancer    Family history of stomach cancer     Past Surgical History:  Procedure Laterality Date   ANKLE FRACTURE SURGERY     FOOT SURGERY     IR IMAGING GUIDED PORT INSERTION  11/25/2022    Social History   Socioeconomic History   Marital status: Divorced    Spouse name: Not on file   Number of children: Not on file   Years of education: Not on file   Highest education level: Not on file  Occupational History   Not on file  Tobacco Use   Smoking status: Former    Types: Cigars    Quit date: 06/10/2022    Years since quitting: 0.5   Smokeless tobacco: Never  Vaping Use   Vaping Use: Some days  Substance and Sexual Activity   Alcohol use: Yes   Drug use: Not Currently   Sexual activity: Not on file  Other Topics Concern   Not on file  Social History Narrative    ** Merged History Encounter **       Social Determinants of Health   Financial Resource Strain: High Risk (11/28/2022)   Overall Financial Resource Strain (CARDIA)    Difficulty of Paying Living Expenses: Very hard  Food Insecurity: No Food Insecurity (11/22/2022)   Hunger Vital Sign    Worried About Running Out of Food in the Last Year: Never true    Ran Out of Food in the Last Year: Never true  Transportation Needs: No Transportation Needs (11/22/2022)   PRAPARE - Administrator, Civil Service (Medical): No    Lack of Transportation (Non-Medical): No  Physical Activity: Not on file  Stress: Not on file  Social Connections: Not on file     FAMILY HISTORY:  We obtained a detailed, 4-generation family history.  Significant diagnoses are listed below: Family History  Problem Relation Age of Onset   Stomach cancer Sister 67 - 36   Other Sister        ovary surgery   Cancer Maternal Aunt        NOS   Breast cancer Paternal Aunt        dx.>50   Melanoma Paternal Aunt 6 - 59   Aneurysm Paternal Grandmother    Bone  cancer Paternal Grandfather        dx.>50   Cancer Cousin 72       pat first cousin with NOS cancer     The patient ha three children who are cancer free.  He has an identical twin brother and two sisters.  One sister has stomach cancer.  The patient is adopted but kept in touch with biological family.  The patient's mother died at 78.  She has multiple siblings, one which had an unknown cancer.  The patient's father had four sisters.  One had melanoma and one had breast cancer.  The paternal grandfather had bone cancer.  Zachary Nelson is unaware of previous family history of genetic testing for hereditary cancer risks. There is no reported Ashkenazi Jewish ancestry. There is no known consanguinity.  GENETIC COUNSELING ASSESSMENT: Zachary Nelson is a 47 y.o. male with a personal and family history of cancer which is somewhat suggestive of a hereditary  cancer syndrome and predisposition to cancer given his young age of onset and his sister's young age of onset of stomach cancer. We, therefore, discussed and recommended the following at today's visit.   DISCUSSION: We discussed that, in general, most cancer is not inherited in families, but instead is sporadic or familial. Sporadic cancers occur by chance and typically happen at older ages (>50 years) as this type of cancer is caused by genetic changes acquired during an individual's lifetime. Some families have more cancers than would be expected by chance; however, the ages or types of cancer are not consistent with a known genetic mutation or known genetic mutations have been ruled out. This type of familial cancer is thought to be due to a combination of multiple genetic, environmental, hormonal, and lifestyle factors. While this combination of factors likely increases the risk of cancer, the exact source of this risk is not currently identifiable or testable.  We discussed that 5 - 10% of cancer is hereditary. Most cases of colon and stomach cancers are associated with Lynch syndrome, while hereditary breast cancer syndromes are more commonly associated with BRCA mutations.   We discussed that testing is beneficial for several reasons including knowing how to follow individuals after completing their treatment, identifying whether potential treatment options such as PARP inhibitors would be beneficial, and understand if other family members could be at risk for cancer and allow them to undergo genetic testing.   We reviewed the characteristics, features and inheritance patterns of hereditary cancer syndromes. We also discussed genetic testing, including the appropriate family members to test, the process of testing, insurance coverage and turn-around-time for results. We discussed the implications of a negative, positive, carrier and/or variant of uncertain significant result. Zachary Nelson  was offered  a common hereditary cancer panel (47 genes) and an expanded pan-cancer panel (77 genes). Zachary Nelson was informed of the benefits and limitations of each panel, including that expanded pan-cancer panels contain genes that do not have clear management guidelines at this point in time.  We also discussed that as the number of genes included on a panel increases, the chances of variants of uncertain significance increases. Zachary Nelson decided to pursue genetic testing for the CancerNext-Expanded+RNA gene panel.   The CancerNext-Expanded gene panel offered by [facility] and includes sequencing and rearrangement analysis for the following 71 genes: AIP, ALK, APC, ATM, BAP1, BARD1, BMPR1A, BRCA1, BRCA2, BRIP1, CDC73, CDH1, CDK4, CDKN1B, CDKN2A, CHEK2, DICER1, FH, FLCN, KIF1B, LZTR1, MAX, MEN1, MET, MLH1, MSH2, MSH6, MUTYH, NF1, NF2, NTHL1,  PALB2, PHOX2B, PMS2, POT1, PRKAR1A, PTCH1, PTEN, RAD51C, RAD51D, RB1, RET, SDHA, SDHAF2, SDHB, SDHC, SDHD, SMAD4, SMARCA4, SMARCB1, SMARCE1, STK11, SUFU, TMEM127, TP53, TSC1, TSC2 and VHL (sequencing and deletion/duplication); AXIN2, CTNNA1, EGFR, EGLN1, HOXB13, KIT, MITF, MSH3, PDGFRA, POLD1 and POLE (sequencing only); EPCAM and GREM1 (deletion/duplication only). RNA data is routinely analyzed for use in variant interpretation for all genes.   Based on Zachary Nelson personal and family history of cancer, he meets medical criteria for genetic testing. Despite that he meets criteria, he may still have an out of pocket cost. We discussed that if his out of pocket cost for testing is over $100, the laboratory will call and confirm whether he wants to proceed with testing.  If the out of pocket cost of testing is less than $100 he will be billed by the genetic testing laboratory.   We discussed that some people do not want to undergo genetic testing due to fear of genetic discrimination.  The Genetic Information Nondiscrimination Act (GINA) was signed into federal law in  2008. GINA prohibits health insurers and most employers from discriminating against individuals based on genetic information (including the results of genetic tests and family history information). According to GINA, health insurance companies cannot consider genetic information to be a preexisting condition, nor can they use it to make decisions regarding coverage or rates. GINA also makes it illegal for most employers to use genetic information in making decisions about hiring, firing, promotion, or terms of employment. It is important to note that GINA does not offer protections for life insurance, disability insurance, or long-term care insurance. GINA does not apply to those in the [employer], those who work for companies with less than 15 employees, and new life insurance or long-term disability insurance policies.  Health status due to a cancer diagnosis is not protected under GINA. More information about GINA can be found by visiting [email or web address].   PLAN: After considering the risks, benefits, and limitations, Zachary Nelson provided informed consent to pursue genetic testing and the blood sample was sent to [facility] for analysis of the CancerNext-Expanded+RNAinsight. Results should be available within approximately 2-3 weeks' time, at which point they will be disclosed by telephone to Zachary Nelson, as will any additional recommendations warranted by these results. Zachary Nelson will receive a summary of his genetic counseling visit and a copy of his results once available. This information will also be available in Epic.   Lastly, we encouraged Zachary Nelson to remain in contact with cancer genetics annually so that we can continuously update the family history and inform him of any changes in cancer genetics and testing that may be of benefit for this family.   Zachary Nelson questions were answered to his satisfaction today. Our contact information was provided should  additional questions or concerns arise. Thank you for the referral and allowing [place] to share in the care of your patient.   Bellarose Burtt P. Lowell Guitar, MS, [facility] Licensed, Patent attorney [email or web address] phone: [phone]  The patient was seen for a total of 30 minutes in face-to-face genetic counseling.  The patient was seen alone.  Drs. Meliton Rattan, and/or [place] were available for questions, if needed..    _______________________________________________________________________ For Office Staff:  Number of people involved in session: 1 Was an Intern/ student involved with case: no

## 2023-01-01 ENCOUNTER — Emergency Department (HOSPITAL_COMMUNITY)
Admission: EM | Admit: 2023-01-01 | Discharge: 2023-01-01 | Disposition: A | Discharge status: Home / Self Care | Payer: 59 | Attending: Emergency Medicine | Admitting: Emergency Medicine

## 2023-01-01 ENCOUNTER — Encounter (HOSPITAL_COMMUNITY): Payer: Self-pay | Admitting: *Deleted

## 2023-01-01 ENCOUNTER — Other Ambulatory Visit: Payer: Self-pay

## 2023-01-01 DIAGNOSIS — K625 Hemorrhage of anus and rectum: Secondary | ICD-10-CM | POA: Diagnosis not present

## 2023-01-01 DIAGNOSIS — K6289 Other specified diseases of anus and rectum: Secondary | ICD-10-CM | POA: Insufficient documentation

## 2023-01-01 HISTORY — DX: Malignant (primary) neoplasm, unspecified: C80.1

## 2023-01-01 MED ORDER — HYDROCODONE-ACETAMINOPHEN 5-325 MG PO TABS
1.0000 | ORAL_TABLET | Freq: Once | ORAL | Status: AC
  Administered 2023-01-01: 1 via ORAL
  Filled 2023-01-01: qty 1

## 2023-01-01 MED ORDER — LIDOCAINE 3 % EX CREA: 1.0000 | TOPICAL_CREAM | Freq: Three times a day (TID) | CUTANEOUS | 0 refills | Status: AC | PRN

## 2023-01-01 MED ORDER — HYDROCODONE-ACETAMINOPHEN 5-325 MG PO TABS: 2.0000 | ORAL_TABLET | Freq: Four times a day (QID) | ORAL | 0 refills | Status: AC | PRN

## 2023-01-01 NOTE — Discharge Instructions (Signed)
As discussed, please use the prescribed medications and discuss these with your oncologist tomorrow.  Return here for concerning changes in your condition.

## 2023-01-01 NOTE — ED Notes (Signed)
Pt verbalized understanding and no further questions. Pt was alert and oriented x4. Pt gait was even and equal at baseline. Pt verbalized his daughter was here to pick him up and was going to drive him home.

## 2023-01-01 NOTE — ED Provider Notes (Signed)
[place] EMERGENCY DEPARTMENT AT [facility] Provider Note   CSN: [id number] Arrival date & time: 01/01/23  1107     History  Chief Complaint  Patient presents with   Rectal Bleeding    Zachary Nelson is a 47 y.o. male.  HPI Patient with ongoing chemotherapy for rectal adenocarcinoma presents with concern for pain and irritation about his rectum.  He notes that attempts to perform hygiene after defecation had resulted in substantial pain with bleeding on the tissue. No abdominal pain, syncope, chest pain, dyspnea. He was prescribed tramadol, notes that this is not sufficient for pain control.     Home Medications Prior to Admission medications   Medication Sig Start Date End Date Taking? Authorizing Provider  HYDROcodone-acetaminophen (NORCO/VICODIN) 5-325 MG tablet Take 2 tablets by mouth every 6 (six) hours as needed for severe pain. 01/01/23  Yes Gerhard Munch, MD  Lidocaine 3 % CREA Apply 1 Application topically 3 (three) times daily as needed. 01/01/23  Yes Gerhard Munch, MD  alum & mag hydroxide-simeth (MAALOX/MYLANTA) 200-200-20 MG/5ML suspension Take 10 mLs by mouth every 6 (six) hours as needed for indigestion or heartburn. 12/26/22 01/25/23  Doreatha Massed, MD  dextrose 5 % SOLN 1,000 mL with fluorouracil 5 GM/100ML SOLN Inject into the vein over 48 hr. Every 14 days 11/29/22   [provider]  FLUOROURACIL IV Inject into the vein every 14 (fourteen) days. 11/29/22   [provider]  LEUCOVORIN CALCIUM IV Inject into the vein every 14 (fourteen) days. 11/29/22   [provider]  lidocaine (XYLOCAINE) 2 % solution Use as directed 15 mLs in the mouth or throat as needed for mouth pain. 12/26/22   Doreatha Massed, MD  lidocaine-prilocaine (EMLA) cream Apply a quarter-sized amount to port a cath site and cover with plastic wrap 1 hour prior to infusion appointments 11/24/22   Doreatha Massed, MD  OXALIPLATIN IV Inject  into the vein every 14 (fourteen) days. 11/29/22   [provider]  prochlorperazine (COMPAZINE) 10 MG tablet Take 1 tablet (10 mg total) by mouth every 6 (six) hours as needed for nausea or vomiting. 11/24/22   Doreatha Massed, MD      Allergies    Bee venom    Review of Systems   Review of Systems  All other systems reviewed and are negative.   Physical Exam Updated Vital Signs BP (!) 107/91 (BP Location: Right Arm)   Pulse 71   Temp 98.6 F (37 C) (Oral)   Resp 14   Ht 5\' 11"  (1.803 m)   Wt 91.9 kg   SpO2 100%   BMI 28.26 kg/m  Physical Exam Vitals and nursing note reviewed.  Constitutional:      General: He is not in acute distress.    Appearance: He is well-developed.  HENT:     Head: Normocephalic and atraumatic.  Eyes:     Conjunctiva/sclera: Conjunctivae normal.  Pulmonary:     Effort: Pulmonary effort is normal. No respiratory distress.     Breath sounds: No stridor.  Abdominal:     General: There is no distension.     Tenderness: There is no guarding.  Genitourinary:    Comments: Patient declined exam of the rectal tissue itself due to concern for hygiene. Skin:    General: Skin is warm and dry.  Neurological:     Mental Status: He is alert and oriented to person, place, and time.     ED Results / Procedures /  Treatments   Labs (all labs ordered are listed, but only abnormal results are displayed) Labs Reviewed - No data to display  EKG None  Radiology No results found.  Procedures Procedures    Medications Ordered in ED Medications  HYDROcodone-acetaminophen (NORCO/VICODIN) 5-325 MG per tablet 1 tablet (has no administration in time range)    ED Course/ Medical Decision Making/ A&P                             Medical Decision Making Adult male with ongoing chemotherapy presents with concern for rectal discomfort and reported bleeding. With his concern for chemotherapy and known malignancy there is suspicion for these  contributing to his bleeding, irritation.  Patient is not currently using any topical medications, and is not on narcotics.  No hemodynamic instability, no abdominal pain reassuring for low suspicion for progression of disease, suspicion for medication related versus intrinsic pain.  Patient declined eval and given his tentative status, this was honored.  Patient will try a course of new medications, will follow-up with oncology team for continuation as needed. Oncology notes reviewed  Amount and/or Complexity of Data Reviewed External Data Reviewed: notes.  Risk Prescription drug management. Decision regarding hospitalization.  Final Clinical Impression(s) / ED Diagnoses Final diagnoses:  Rectal pain    Rx / DC Orders ED Discharge Orders          Ordered    Lidocaine 3 % CREA  3 times daily PRN        01/01/23 1208    HYDROcodone-acetaminophen (NORCO/VICODIN) 5-325 MG tablet  Every 6 hours PRN        01/01/23 1208              Gerhard Munch, MD 01/01/23 1209

## 2023-01-01 NOTE — ED Triage Notes (Signed)
Pt with rectal bleeding off and on since February.  Pt with itching, burning and irritated to bottom.  Pt with rectum cancer and receiving chemo currently- last tx was 6/17.

## 2023-01-03 ENCOUNTER — Other Ambulatory Visit: Payer: Self-pay

## 2023-01-05 ENCOUNTER — Encounter: Payer: Self-pay | Admitting: Genetic Counselor

## 2023-01-05 DIAGNOSIS — Z1379 Encounter for other screening for genetic and chromosomal anomalies: Secondary | ICD-10-CM | POA: Insufficient documentation

## 2023-01-09 ENCOUNTER — Telehealth: Payer: Self-pay | Admitting: Genetic Counselor

## 2023-01-09 ENCOUNTER — Ambulatory Visit: Payer: Self-pay | Admitting: Genetic Counselor

## 2023-01-09 ENCOUNTER — Encounter: Payer: Self-pay | Admitting: Hematology

## 2023-01-09 DIAGNOSIS — Z1379 Encounter for other screening for genetic and chromosomal anomalies: Secondary | ICD-10-CM

## 2023-01-09 NOTE — Progress Notes (Addendum)
HPI:  [name] was previously seen in the [facility] due to a personal and family history of cancer and concerns regarding a hereditary predisposition to cancer. Please refer to our prior cancer genetics clinic note for more information regarding our discussion, assessment and recommendations, at the time. [name] recent genetic test results were disclosed to him, as were recommendations warranted by these results. These results and recommendations are discussed in more detail below.  CANCER HISTORY:  Oncology History  Rectal adenocarcinoma (HCC)  11/18/2022 Initial Diagnosis   Rectal adenocarcinoma (HCC)   11/29/2022 -  Chemotherapy   Patient is on Treatment Plan : COLORECTAL FOLFOX q14d x 4 months     01/04/2023 Genetic Testing   Negative genetic testing on the CancerNext-Expanded+RNAinsight.  CDH1 p.V495A (c.1484T>C) VUS was found.  The report date is January 04, 2023.  The CancerNext-Expanded gene panel offered by [facility] and includes sequencing and rearrangement analysis for the following 77 genes: AIP, ALK, APC*, ATM*, AXIN2, BAP1, BARD1, BMPR1A, BRCA1*, BRCA2*, BRIP1*, CDC73, CDH1*, CDK4, CDKN1B, CDKN2A, CHEK2*, CTNNA1, DICER1, FH, FLCN, KIF1B, LZTR1, MAX, MEN1, MET, MLH1*, MSH2*, MSH3, MSH6*, MUTYH*, NF1*, NF2, NTHL1, PALB2*, PHOX2B, PMS2*, POT1, PRKAR1A, PTCH1, PTEN*, RAD51C*, RAD51D*, RB1, RET, SDHA, SDHAF2, SDHB, SDHC, SDHD, SMAD4, SMARCA4, SMARCB1, SMARCE1, STK11, SUFU, TMEM127, TP53*, TSC1, TSC2, and VHL (sequencing and deletion/duplication); EGFR, EGLN1, HOXB13, KIT, MITF, PDGFRA, POLD1, and POLE (sequencing only); EPCAM and GREM1 (deletion/duplication only). DNA and RNA analyses performed for * genes.      FAMILY HISTORY:  We obtained a detailed, 4-generation family history.  Significant diagnoses are listed below: Family History  Problem Relation Age of Onset   Stomach cancer Sister 84 - 60   Other Sister        ovary surgery   Cancer  Maternal Aunt        NOS   Breast cancer Paternal Aunt        dx.>50   Melanoma Paternal Aunt 35 - 59   Aneurysm Paternal Grandmother    Bone cancer Paternal Grandfather        dx.>50   Cancer Cousin 60       pat first cousin with NOS cancer       The patient has three children who are cancer free.  He has an identical twin brother and two sisters.  One sister has stomach cancer.  The patient is adopted but kept in touch with biological family.   The patient's mother died at 64.  She has multiple siblings, one which had an unknown cancer.   The patient's father had four sisters.  One had melanoma and one had breast cancer.  The paternal grandfather had bone cancer.   [name] is unaware of previous family history of genetic testing for hereditary cancer risks. There is no reported Ashkenazi Jewish ancestry. There is no known consanguinity  GENETIC TEST RESULTS: Genetic testing reported out on January 04, 2023 through the CancerNext-Expanded+RNAinsight cancer panel found no pathogenic mutations. The CancerNext-Expanded gene panel offered by [facility] and includes sequencing and rearrangement analysis for the following 71 genes: AIP, ALK, APC, ATM, BAP1, BARD1, BMPR1A, BRCA1, BRCA2, BRIP1, CDC73, CDH1, CDK4, CDKN1B, CDKN2A, CHEK2, DICER1, FH, FLCN, KIF1B, LZTR1, MAX, MEN1, MET, MLH1, MSH2, MSH6, MUTYH, NF1, NF2, NTHL1, PALB2, PHOX2B, PMS2, POT1, PRKAR1A, PTCH1, PTEN, RAD51C, RAD51D, RB1, RET, SDHA, SDHAF2, SDHB, SDHC, SDHD, SMAD4, SMARCA4, SMARCB1, SMARCE1, STK11, SUFU, TMEM127, TP53, TSC1, TSC2 and VHL (sequencing and deletion/duplication); AXIN2, CTNNA1, EGFR,  EGLN1, HOXB13, KIT, MITF, MSH3, PDGFRA, POLD1 and POLE (sequencing only); EPCAM and GREM1 (deletion/duplication only). RNA data is routinely analyzed for use in variant interpretation for all genes.  The test report has been scanned into EPIC and is located under the Molecular Pathology section of the Results Review tab.  A  portion of the result report is included below for reference.     We discussed with [name] that because current genetic testing is not perfect, it is possible there may be a gene mutation in one of these genes that current testing cannot detect, but that chance is small.  We also discussed, that there could be another gene that has not yet been discovered, or that we have not yet tested, that is responsible for the cancer diagnoses in the family. It is also possible there is a hereditary cause for the cancer in the family that [name] did not inherit and therefore was not identified in his testing.  Therefore, it is important to remain in touch with cancer genetics in the future so that we can continue to offer [name] the most up to date genetic testing.   Genetic testing did identify a variant of uncertain significance (VUS) was identified in the CDH1 gene called p.V495A (c.1484T>C).  At this time, it is unknown if this variant is associated with increased cancer risk or if this is a normal finding, but most variants such as this get reclassified to being inconsequential. It should not be used to make medical management decisions. With time, we suspect the lab will determine the significance of this variant, if any. If we do learn more about it, we will try to contact [name] to discuss it further. However, it is important to stay in touch with [place] periodically and keep the address and phone number up to date.  ADDITIONAL GENETIC TESTING: We discussed with [name] that his genetic testing was fairly extensive.  If there are genes identified to increase cancer risk that can be analyzed in the future, we would be happy to discuss and coordinate this testing at that time.    CANCER SCREENING RECOMMENDATIONS: [name] test result is considered negative (normal).  This means that we have not identified a hereditary cause for his personal and family history of cancer  at this time. Most cancers happen by chance and this negative test suggests that his cancer may fall into this category.    Possible reasons for [name] negative genetic test include:  1. There may be a gene mutation in one of these genes that current testing methods cannot detect but that chance is small.  2. There could be another gene that has not yet been discovered, or that we have not yet tested, that is responsible for the cancer diagnoses in the family.  3.  There may be no hereditary risk for cancer in the family. The cancers in [name] and/or his family may be sporadic/familial or due to other genetic and environmental factors. 4. It is also possible there is a hereditary cause for the cancer in the family that [name] did not inherit.  Therefore, it is recommended he continue to follow the cancer management and screening guidelines provided by his oncology and primary healthcare provider. An individual's cancer risk and medical management are not determined by genetic test results alone. Overall cancer risk assessment incorporates additional factors, including personal medical history, family history, and any available genetic information that may result in a  personalized plan for cancer prevention and surveillance  RECOMMENDATIONS FOR FAMILY MEMBERS:  Individuals in this family might be at some increased risk of developing cancer, over the general population risk, simply due to the family history of cancer.  We recommended women in this family have a yearly mammogram beginning at age 67, or 54 years younger than the earliest onset of cancer, an annual clinical breast exam, and perform monthly breast self-exams. Women in this family should also have a gynecological exam as recommended by their primary provider. All family members should be referred for colonoscopy starting at age 47.  FOLLOW-UP: Lastly, we discussed with [name] that cancer genetics is a rapidly  advancing field and it is possible that new genetic tests will be appropriate for him and/or his family members in the future. We encouraged him to remain in contact with cancer genetics on an annual basis so we can update his personal and family histories and let him know of advances in cancer genetics that may benefit this family.   Our contact number was provided. [name] questions were answered to his satisfaction, and he knows he is welcome to call us at anytime with additional questions or concerns.   Maylon Cos, MS, [facility] Licensed, Certified Genetic Counselor [email or web address]

## 2023-01-09 NOTE — Telephone Encounter (Signed)
LM on VM that results are back and to please call.  Left CB instructions. 

## 2023-01-09 NOTE — Telephone Encounter (Signed)
Revealed negative genetic testing.  Discussed that we do not know why he has rectal cancer or why there is cancer in the family. It could be due to a different gene that we are not testing, or maybe our current technology may not be able to pick something up.  It will be important for him to keep in contact with genetics to keep up with whether additional testing may be needed.  There is one CDH1 VUS that should not be used to change medical management.

## 2023-01-10 ENCOUNTER — Inpatient Hospital Stay: Payer: 59

## 2023-01-10 ENCOUNTER — Other Ambulatory Visit: Payer: Self-pay

## 2023-01-10 ENCOUNTER — Inpatient Hospital Stay: Payer: 59 | Admitting: Hematology

## 2023-01-10 NOTE — Progress Notes (Signed)
[facility] [address], [place] [address]    Clinic Day:  01/11/2023  Referring physician: Marlise Eves, MD  Patient Care Team: Patient, No Pcp Per as PCP - General (General Practice) Doreatha Massed, MD as Medical Oncologist (Medical Oncology) Therese Sarah, RN as Oncology Nurse Navigator (Medical Oncology)   ASSESSMENT & PLAN:   Assessment: 1.  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 - Colonoscopy and biopsy (10/13/2022): Adenocarcinoma.  MMR preserved. - Evaluated at [facility] cancer care. - Pelvic MRI (10/21/2022): A semi-annular ulcerated T3 N1 low rectal tumor with mucinous features sitting at or just above the level of the sphincter complex without demonstrable infiltration of the sphincter complex.  Lymph node measures 5 mm in short axis with minimum distance 5 mm from MRF at 9:00.  Several additional small rounded lymph nodes seen in the vicinity which fall below the threshold.  No demonstrable extra mesorectal pelvic metastatic disease. - Cycle 1 of FOLFOX started on 11/29/2022   2.  Social/family history: - [name]  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  Quit smoking 6 months ago.  Smoked half to 1 pack/day starting at age 28. - Sister had stomach cancer.  Another sister had colon polyps removed.  Maternal aunt had lung cancer.  Maternal great grandfather had stomach cancer.  Paternal grandfather had bone cancer.  Paternal aunt had skin cancer.  Another paternal aunt had breast cancer.  Paternal uncle had lung cancer.    Plan: 1.  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 - He reported rectal bleeding on 01/01/2023 and was evaluated in the ER.  He was prescribed lidocaine and Norco. - He reports cold sensitivity lasting all 2 weeks.  Denies any tingling or numbness  in the extremities. - Reviewed labs today: Normal LFTs.  Creatinine 1.29.  CBC shows white count is 3.2 and ANC is 1.1. - Recommend proceeding with cycle 4 today.  Will add G-CSF on day 3. - Will follow-up on Signatera testing ordered at [facility] cancer care.  Genetic testing was also ordered.   2.  Rectal pain: - Continue tramadol as needed.  He was evaluated in the ER on 01/01/2023 and was given some Norco.    No orders of the defined types were placed in this encounter.     I,Katie Daubenspeck,acting as a Neurosurgeon for Doreatha Massed, MD.,have documented all relevant documentation on the behalf of Doreatha Massed, MD,as directed by  Doreatha Massed, MD while in the presence of Doreatha Massed, MD.   I, Doreatha Massed MD, have reviewed the above documentation for accuracy and completeness, and I agree with the above.   Doreatha Massed, MD   7/3/20247:32 PM  CHIEF COMPLAINT:   Diagnosis: rectal adenocarcinoma    Cancer Staging  Rectal adenocarcinoma [facility]) Staging form: Colon and Rectum, AJCC 8th Edition - Clinical stage from 11/18/2022: Stage IIIB (cT3, cN1b, cM0) - Unsigned    Prior Therapy: none  Current Therapy:  neoadjuvant FOLFOX    HISTORY OF PRESENT ILLNESS:   Oncology History  Rectal adenocarcinoma (HCC)  11/18/2022 Initial Diagnosis   Rectal adenocarcinoma (HCC)   11/29/2022 -  Chemotherapy   Patient is on Treatment Plan : COLORECTAL FOLFOX q14d x 4 months     01/04/2023 Genetic Testing   Negative  genetic testing on the CancerNext-Expanded+RNAinsight.  CDH1 p.V495A (c.1484T>C) VUS was found.  The report date is January 04, 2023.  The CancerNext-Expanded gene panel offered by [facility] and includes sequencing and rearrangement analysis for the following 77 genes: AIP, ALK, APC*, ATM*, AXIN2, BAP1, BARD1, BMPR1A, BRCA1*, BRCA2*, BRIP1*, CDC73, CDH1*, CDK4, CDKN1B, CDKN2A, CHEK2*, CTNNA1, DICER1, FH, FLCN, KIF1B, LZTR1, MAX, MEN1, MET,  MLH1*, MSH2*, MSH3, MSH6*, MUTYH*, NF1*, NF2, NTHL1, PALB2*, PHOX2B, PMS2*, POT1, PRKAR1A, PTCH1, PTEN*, RAD51C*, RAD51D*, RB1, RET, SDHA, SDHAF2, SDHB, SDHC, SDHD, SMAD4, SMARCA4, SMARCB1, SMARCE1, STK11, SUFU, TMEM127, TP53*, TSC1, TSC2, and VHL (sequencing and deletion/duplication); EGFR, EGLN1, HOXB13, KIT, MITF, PDGFRA, POLD1, and POLE (sequencing only); EPCAM and GREM1 (deletion/duplication only). DNA and RNA analyses performed for * genes.       INTERVAL HISTORY:   Derris is a 47 y.o. male presenting to clinic today for follow up of rectal adenocarcinoma. He was last seen by me on 12/13/22.  Since his last visit, he had genetic testing and counseling on 12/27/22. Results were negative with a VUS in CDH1.  Today, he states that he is doing well overall. His appetite level is at 75%. His energy level is at 7 5%.  PAST MEDICAL HISTORY:   Past Medical History: Past Medical History:  Diagnosis Date   Cancer (HCC)    Family history of breast cancer    Family history of stomach cancer     Surgical History: Past Surgical History:  Procedure Laterality Date   ANKLE FRACTURE SURGERY     FOOT SURGERY     IR IMAGING GUIDED PORT INSERTION  11/25/2022    Social History: Social History   Socioeconomic History   Marital status: Divorced    Spouse name: Not on file   Number of children: Not on file   Years of education: Not on file   Highest education level: Not on file  Occupational History   Not on file  Tobacco Use   Smoking status: Some Days    Types: Cigars    Last attempt to quit: 06/10/2022    Years since quitting: 0.5   Smokeless tobacco: Never  Vaping Use   Vaping Use: Some days  Substance and Sexual Activity   Alcohol use: Yes   Drug use: Not Currently   Sexual activity: Not on file  Other Topics Concern   Not on file  Social History Narrative   ** Merged History Encounter **       Social Determinants of Health   Financial Resource Strain: High Risk (11/28/2022)    Overall Financial Resource Strain (CARDIA)    Difficulty of Paying Living Expenses: Very hard  Food Insecurity: No Food Insecurity (11/22/2022)   Hunger Vital Sign    Worried About Running Out of Food in the Last Year: Never true    Ran Out of Food in the Last Year: Never true  Transportation Needs: No Transportation Needs (11/22/2022)   PRAPARE - Administrator, Civil Service (Medical): No    Lack of Transportation (Non-Medical): No  Physical Activity: Not on file  Stress: Not on file  Social Connections: Not on file  Intimate Partner Violence: Not At Risk (11/22/2022)   Humiliation, Afraid, Rape, and Kick questionnaire    Fear of Current or Ex-Partner: No    Emotionally Abused: No    Physically Abused: No    Sexually Abused: No    Family History: Family History  Problem Relation Age of Onset   Stomach  cancer Sister 74 - 67   Other Sister        ovary surgery   Cancer Maternal Aunt        NOS   Breast cancer Paternal Aunt        dx.>50   Melanoma Paternal Aunt 76 - 59   Aneurysm Paternal Grandmother    Bone cancer Paternal Grandfather        dx.>50   Cancer Cousin 3       pat first cousin with NOS cancer    Current Medications:  Current Outpatient Medications:    alum & mag hydroxide-simeth (MAALOX/MYLANTA) 200-200-20 MG/5ML suspension, Take 10 mLs by mouth every 6 (six) hours as needed for indigestion or heartburn., Disp: 300 mL, Rfl: 1   dextrose 5 % SOLN 1,000 mL with fluorouracil 5 GM/100ML SOLN, Inject into the vein over 48 hr. Every 14 days, Disp: , Rfl:    FLUOROURACIL IV, Inject into the vein every 14 (fourteen) days., Disp: , Rfl:    HYDROcodone-acetaminophen (NORCO/VICODIN) 5-325 MG tablet, Take 2 tablets by mouth every 6 (six) hours as needed for severe pain., Disp: 12 tablet, Rfl: 0   LEUCOVORIN CALCIUM IV, Inject into the vein every 14 (fourteen) days., Disp: , Rfl:    lidocaine (XYLOCAINE) 2 % solution, Use as directed 15 mLs in the mouth or  throat as needed for mouth pain., Disp: 10 mL, Rfl: 1   Lidocaine 3 % CREA, Apply 1 Application topically 3 (three) times daily as needed., Disp: 28.35 g, Rfl: 0   lidocaine-prilocaine (EMLA) cream, Apply a quarter-sized amount to port a cath site and cover with plastic wrap 1 hour prior to infusion appointments, Disp: 30 g, Rfl: 3   OXALIPLATIN IV, Inject into the vein every 14 (fourteen) days., Disp: , Rfl:    prochlorperazine (COMPAZINE) 10 MG tablet, Take 1 tablet (10 mg total) by mouth every 6 (six) hours as needed for nausea or vomiting., Disp: 60 tablet, Rfl: 4 No current facility-administered medications for this visit.  Facility-Administered Medications Ordered in Other Visits:    fluorouracil (ADRUCIL) 5,000 mg in sodium chloride 0.9 % 150 mL chemo infusion, 2,400 mg/m2 (Treatment Plan Recorded), Intravenous, 1 day or 1 dose, Doreatha Massed, MD, Infusion Verify at 01/11/23 1507   Allergies: Allergies  Allergen Reactions   Bee Venom     REVIEW OF SYSTEMS:   Review of Systems  Constitutional:  Negative for chills, fatigue and fever.  HENT:   Negative for lump/mass, mouth sores, nosebleeds, sore throat and trouble swallowing.   Eyes:  Negative for eye problems.  Respiratory:  Positive for shortness of breath. Negative for cough.   Cardiovascular:  Negative for chest pain, leg swelling and palpitations.  Gastrointestinal:  Positive for nausea and vomiting. Negative for abdominal pain, constipation and diarrhea.  Genitourinary:  Negative for bladder incontinence, difficulty urinating, dysuria, frequency, hematuria and nocturia.   Musculoskeletal:  Negative for arthralgias, back pain, flank pain, myalgias and neck pain.  Skin:  Negative for itching and rash.  Neurological:  Positive for numbness (Cold sensitivity). Negative for dizziness and headaches.  Hematological:  Does not bruise/bleed easily.  Psychiatric/Behavioral:  Positive for sleep disturbance. Negative for  depression and suicidal ideas. The patient is not nervous/anxious.   All other systems reviewed and are negative.    VITALS:   There were no vitals taken for this visit.  Wt Readings from Last 3 Encounters:  01/11/23 202 lb 6.4 oz (91.8 kg)  01/01/23 202  lb 9.6 oz (91.9 kg)  12/26/22 202 lb 9.6 oz (91.9 kg)    There is no height or weight on file to calculate BMI.  Performance status (ECOG): 1 - Symptomatic but completely ambulatory  PHYSICAL EXAM:   Physical Exam Vitals and nursing note reviewed. Exam conducted with a chaperone present.  Constitutional:      Appearance: Normal appearance.  Cardiovascular:     Rate and Rhythm: Normal rate and regular rhythm.     Pulses: Normal pulses.     Heart sounds: Normal heart sounds.  Pulmonary:     Effort: Pulmonary effort is normal.     Breath sounds: Normal breath sounds.  Abdominal:     Palpations: Abdomen is soft. There is no hepatomegaly, splenomegaly or mass.     Tenderness: There is no abdominal tenderness.  Musculoskeletal:     Right lower leg: No edema.     Left lower leg: No edema.  Lymphadenopathy:     Cervical: No cervical adenopathy.     Right cervical: No superficial, deep or posterior cervical adenopathy.    Left cervical: No superficial, deep or posterior cervical adenopathy.     Upper Body:     Right upper body: No supraclavicular or axillary adenopathy.     Left upper body: No supraclavicular or axillary adenopathy.  Neurological:     General: No focal deficit present.     Mental Status: He is alert and oriented to person, place, and time.  Psychiatric:        Mood and Affect: Mood normal.        Behavior: Behavior normal.     LABS:      Latest Ref Rng & Units 01/11/2023    9:26 AM 12/26/2022    9:14 AM 12/13/2022    8:11 AM  CBC  WBC 4.0 - 10.5 K/uL 3.2  3.3  4.1   Hemoglobin 13.0 - 17.0 g/dL 64.3  32.9  51.8   Hematocrit 39.0 - 52.0 % 37.0  34.3  37.3   Platelets 150 - 400 K/uL 146  131  226        Latest Ref Rng & Units 01/11/2023    9:26 AM 12/26/2022    9:14 AM 12/13/2022    8:11 AM  CMP  Glucose 70 - 99 mg/dL 841  660  630   BUN 6 - 20 mg/dL 13  13  15    Creatinine 0.61 - 1.24 mg/dL 1.60  1.09  3.23   Sodium 135 - 145 mmol/L 136  137  134   Potassium 3.5 - 5.1 mmol/L 3.8  3.5  3.9   Chloride 98 - 111 mmol/L 106  106  103   CO2 22 - 32 mmol/L 24  25  25    Calcium 8.9 - 10.3 mg/dL 8.7  8.5  8.7   Total Protein 6.5 - 8.1 g/dL 7.2  6.9  7.0   Total Bilirubin 0.3 - 1.2 mg/dL 0.8  0.6  0.4   Alkaline Phos 38 - 126 U/L 61  57  61   AST 15 - 41 U/L 24  25  24    ALT 0 - 44 U/L 21  20  23       Lab Results  Component Value Date   CEA1 3.1 11/29/2022   /  CEA  Date Value Ref Range Status  11/29/2022 3.1 0.0 - 4.7 ng/mL Final    Comment:    (NOTE)  Nonsmokers          <3.9                             Smokers             <5.6 [employer] Electrochemiluminescence Immunoassay (ECLIA) Values obtained with different assay methods or kits cannot be used interchangeably.  Results cannot be interpreted as absolute evidence of the presence or absence of malignant disease. Performed At: [facility] [address] [place], [place] [id number] Jolene Schimke MD WU:[id number]    No results found for: "PSA1" No results found for: "[id number]" No results found for: "CAN125"  No results found for: "TOTALPROTELP", "ALBUMINELP", "A1GS", "A2GS", "BETS", "BETA2SER", "GAMS", "MSPIKE", "SPEI" No results found for: "TIBC", "FERRITIN", "IRONPCTSAT" No results found for: "LDH"   STUDIES:   No results found.

## 2023-01-11 ENCOUNTER — Encounter: Payer: Self-pay | Admitting: Hematology

## 2023-01-11 ENCOUNTER — Inpatient Hospital Stay: Payer: 59 | Attending: Hematology | Admitting: Hematology

## 2023-01-11 ENCOUNTER — Inpatient Hospital Stay: Payer: 59

## 2023-01-11 VITALS — BP 118/81 | HR 87 | Temp 97.8°F | Resp 18

## 2023-01-11 DIAGNOSIS — R739 Hyperglycemia, unspecified: Secondary | ICD-10-CM | POA: Diagnosis not present

## 2023-01-11 DIAGNOSIS — F32A Depression, unspecified: Secondary | ICD-10-CM | POA: Diagnosis not present

## 2023-01-11 DIAGNOSIS — C20 Malignant neoplasm of rectum: Secondary | ICD-10-CM | POA: Diagnosis not present

## 2023-01-11 DIAGNOSIS — Z5189 Encounter for other specified aftercare: Secondary | ICD-10-CM | POA: Diagnosis not present

## 2023-01-11 DIAGNOSIS — Z452 Encounter for adjustment and management of vascular access device: Secondary | ICD-10-CM | POA: Insufficient documentation

## 2023-01-11 DIAGNOSIS — R7401 Elevation of levels of liver transaminase levels: Secondary | ICD-10-CM | POA: Diagnosis not present

## 2023-01-11 DIAGNOSIS — Z5111 Encounter for antineoplastic chemotherapy: Secondary | ICD-10-CM | POA: Diagnosis present

## 2023-01-11 LAB — CBC WITH DIFFERENTIAL/PLATELET
Abs Immature Granulocytes: 0.01 10*3/uL (ref 0.00–0.07)
Basophils Absolute: 0 10*3/uL (ref 0.0–0.1)
Basophils Relative: 0 %
Eosinophils Absolute: 0.1 10*3/uL (ref 0.0–0.5)
Eosinophils Relative: 3 %
HCT: 37 % — ABNORMAL LOW (ref 39.0–52.0)
Hemoglobin: 12.1 g/dL — ABNORMAL LOW (ref 13.0–17.0)
Immature Granulocytes: 0 %
Lymphocytes Relative: 49 %
Lymphs Abs: 1.6 10*3/uL (ref 0.7–4.0)
MCH: 28.7 pg (ref 26.0–34.0)
MCHC: 32.7 g/dL (ref 30.0–36.0)
MCV: 87.7 fL (ref 80.0–100.0)
Monocytes Absolute: 0.4 10*3/uL (ref 0.1–1.0)
Monocytes Relative: 13 %
Neutro Abs: 1.1 10*3/uL — ABNORMAL LOW (ref 1.7–7.7)
Neutrophils Relative %: 35 %
Platelets: 146 10*3/uL — ABNORMAL LOW (ref 150–400)
RBC: 4.22 MIL/uL (ref 4.22–5.81)
RDW: 15.4 % (ref 11.5–15.5)
WBC: 3.2 10*3/uL — ABNORMAL LOW (ref 4.0–10.5)
nRBC: 0 % (ref 0.0–0.2)

## 2023-01-11 LAB — COMPREHENSIVE METABOLIC PANEL
ALT: 21 U/L (ref 0–44)
AST: 24 U/L (ref 15–41)
Albumin: 3.7 g/dL (ref 3.5–5.0)
Alkaline Phosphatase: 61 U/L (ref 38–126)
Anion gap: 6 (ref 5–15)
BUN: 13 mg/dL (ref 6–20)
CO2: 24 mmol/L (ref 22–32)
Calcium: 8.7 mg/dL — ABNORMAL LOW (ref 8.9–10.3)
Chloride: 106 mmol/L (ref 98–111)
Creatinine, Ser: 1.29 mg/dL — ABNORMAL HIGH (ref 0.61–1.24)
GFR, Estimated: >60 mL/min (ref >60)
Glucose, Bld: 101 mg/dL — ABNORMAL HIGH (ref 70–99)
Potassium: 3.8 mmol/L (ref 3.5–5.1)
Sodium: 136 mmol/L (ref 135–145)
Total Bilirubin: 0.8 mg/dL (ref 0.3–1.2)
Total Protein: 7.2 g/dL (ref 6.5–8.1)

## 2023-01-11 LAB — MAGNESIUM: Magnesium: 2 mg/dL (ref 1.7–2.4)

## 2023-01-11 MED ORDER — OXALIPLATIN CHEMO INJECTION 100 MG/20ML
85.0000 mg/m2 | Freq: Once | INTRAVENOUS | Status: AC
  Administered 2023-01-11: 200 mg via INTRAVENOUS
  Filled 2023-01-11: qty 40

## 2023-01-11 MED ORDER — FLUOROURACIL CHEMO INJECTION 2.5 GM/50ML
400.0000 mg/m2 | Freq: Once | INTRAVENOUS | Status: AC
  Administered 2023-01-11: 850 mg via INTRAVENOUS
  Filled 2023-01-11: qty 17

## 2023-01-11 MED ORDER — PALONOSETRON HCL INJECTION 0.25 MG/5ML
0.2500 mg | Freq: Once | INTRAVENOUS | Status: AC
  Administered 2023-01-11: 0.25 mg via INTRAVENOUS
  Filled 2023-01-11: qty 5

## 2023-01-11 MED ORDER — LEUCOVORIN CALCIUM INJECTION 350 MG
400.0000 mg/m2 | Freq: Once | INTRAVENOUS | Status: AC
  Administered 2023-01-11: 872 mg via INTRAVENOUS
  Filled 2023-01-11: qty 43.6

## 2023-01-11 MED ORDER — DEXTROSE 5 % IV SOLN: Freq: Once | INTRAVENOUS | Status: AC

## 2023-01-11 MED ORDER — SODIUM CHLORIDE 0.9 % IV SOLN
10.0000 mg | Freq: Once | INTRAVENOUS | Status: AC
  Administered 2023-01-11: 10 mg via INTRAVENOUS
  Filled 2023-01-11: qty 10

## 2023-01-11 MED ORDER — SODIUM CHLORIDE 0.9 % IV SOLN
2400.0000 mg/m2 | INTRAVENOUS | Status: DC
  Administered 2023-01-11: 5000 mg via INTRAVENOUS
  Filled 2023-01-11: qty 100

## 2023-01-11 NOTE — Patient Instructions (Signed)
[facility] at [facility] Discharge Instructions   You were seen and examined today by Dr. Ellin Saba.  He reviewed the results of your lab work which are mostly normal/stable. Your neutrophils (your white blood cells that help fight infection) are low and trending downward. We will add a white blood cell booster shot to your treatment plan. This will be given each time you come to get the pump taken off.   We will proceed with your treatment today.   Return as scheduled.    Thank you for choosing [facility] at [facility] to provide your oncology and hematology care.  To afford each patient quality time with our provider, please arrive at least 15 minutes before your scheduled appointment time.   If you have a lab appointment with the Cancer Center please come in thru the Main Entrance and check in at the main information desk.  You need to re-schedule your appointment should you arrive 10 or more minutes late.  We strive to give you quality time with our providers, and arriving late affects you and other patients whose appointments are after yours.  Also, if you no show three or more times for appointments you may be dismissed from the clinic at the providers discretion.     Again, thank you for choosing [facility].  Our hope is that these requests will decrease the amount of time that you wait before being seen by our physicians.       _____________________________________________________________  Should you have questions after your visit to [facility], please contact our office at [phone] and follow the prompts.  Our office hours are 8:00 a.m. and 4:30 p.m. Monday - Friday.  Please note that voicemails left after 4:00 p.m. may not be returned until the following business day.  We are closed weekends and major holidays.  You do have access to a nurse 24-7, just call the main number to the clinic [phone] and  do not press any options, hold on the line and a nurse will answer the phone.    For prescription refill requests, have your pharmacy contact our office and allow 72 hours.    Due to Covid, you will need to wear a mask upon entering the hospital. If you do not have a mask, a mask will be given to you at the Main Entrance upon arrival. For doctor visits, patients may have 1 support person age 28 or older with them. For treatment visits, patients can not have anyone with them due to social distancing guidelines and our immunocompromised population.

## 2023-01-11 NOTE — Patient Instructions (Signed)
[facility]  Discharge Instructions: Thank you for choosing [facility] to provide your oncology and hematology care.  If you have a lab appointment with the Cancer Center - please note that after April 8th, 2024, all labs will be drawn in the cancer center.  You do not have to check in or register with the main entrance as you have in the past but will complete your check-in in the cancer center.  Wear comfortable clothing and clothing appropriate for easy access to any Portacath or PICC line.   We strive to give you quality time with your provider. You may need to reschedule your appointment if you arrive late (15 or more minutes).  Arriving late affects you and other patients whose appointments are after yours.  Also, if you miss three or more appointments without notifying the office, you may be dismissed from the clinic at the provider's discretion.      For prescription refill requests, have your pharmacy contact our office and allow 72 hours for refills to be completed.    Today you received the following chemotherapy and/or immunotherapy agents Folfox   To help prevent nausea and vomiting after your treatment, we encourage you to take your nausea medication as directed.  Oxaliplatin Injection What is this medication? OXALIPLATIN (ox AL i PLA tin) treats colorectal cancer. It works by slowing down the growth of cancer cells. This medicine may be used for other purposes; ask your health care provider or pharmacist if you have questions. COMMON BRAND NAME(S): Eloxatin What should I tell my care team before I take this medication? They need to know if you have any of these conditions: Heart disease History of irregular heartbeat or rhythm Liver disease Low blood cell levels (white cells, red cells, and platelets) Lung or breathing disease, such as asthma Take medications that treat or prevent blood clots Tingling of the fingers, toes, or other nerve  disorder An unusual or allergic reaction to oxaliplatin, other medications, foods, dyes, or preservatives If you or your partner are pregnant or trying to get pregnant Breast-feeding How should I use this medication? This medication is injected into a vein. It is given by your care team in a hospital or clinic setting. Talk to your care team about the use of this medication in children. Special care may be needed. Overdosage: If you think you have taken too much of this medicine contact a poison control center or emergency room at once. NOTE: This medicine is only for you. Do not share this medicine with others. What if I miss a dose? Keep appointments for follow-up doses. It is important not to miss a dose. Call your care team if you are unable to keep an appointment. What may interact with this medication? Do not take this medication with any of the following: Cisapride Dronedarone Pimozide Thioridazine This medication may also interact with the following: Aspirin and aspirin-like medications Certain medications that treat or prevent blood clots, such as warfarin, apixaban, dabigatran, and rivaroxaban Cisplatin Cyclosporine Diuretics Medications for infection, such as acyclovir, adefovir, amphotericin B, bacitracin, cidofovir, foscarnet, ganciclovir, gentamicin, pentamidine, vancomycin NSAIDs, medications for pain and inflammation, such as ibuprofen or naproxen Other medications that cause heart rhythm changes Pamidronate Zoledronic acid This list may not describe all possible interactions. Give your health care provider a list of all the medicines, herbs, non-prescription drugs, or dietary supplements you use. Also tell them if you smoke, drink alcohol, or use illegal drugs. Some items   may interact with your medicine. What should I watch for while using this medication? Your condition will be monitored carefully while you are receiving this medication. You may need blood work while  taking this medication. This medication may make you feel generally unwell. This is not uncommon as chemotherapy can affect healthy cells as well as cancer cells. Report any side effects. Continue your course of treatment even though you feel ill unless your care team tells you to stop. This medication may increase your risk of getting an infection. Call your care team for advice if you get a fever, chills, sore throat, or other symptoms of a cold or flu. Do not treat yourself. Try to avoid being around people who are sick. Avoid taking medications that contain aspirin, acetaminophen, ibuprofen, naproxen, or ketoprofen unless instructed by your care team. These medications may hide a fever. Be careful brushing or flossing your teeth or using a toothpick because you may get an infection or bleed more easily. If you have any dental work done, tell your dentist you are receiving this medication. This medication can make you more sensitive to cold. Do not drink cold drinks or use ice. Cover exposed skin before coming in contact with cold temperatures or cold objects. When out in cold weather wear warm clothing and cover your mouth and nose to warm the air that goes into your lungs. Tell your care team if you get sensitive to the cold. Talk to your care team if you or your partner are pregnant or think either of you might be pregnant. This medication can cause serious birth defects if taken during pregnancy and for 9 months after the last dose. A negative pregnancy test is required before starting this medication. A reliable form of contraception is recommended while taking this medication and for 9 months after the last dose. Talk to your care team about effective forms of contraception. Do not father a child while taking this medication and for 6 months after the last dose. Use a condom while having sex during this time period. Do not breastfeed while taking this medication and for 3 months after the last  dose. This medication may cause infertility. Talk to your care team if you are concerned about your fertility. What side effects may I notice from receiving this medication? Side effects that you should report to your care team as soon as possible: Allergic reactions--skin rash, itching, hives, swelling of the face, lips, tongue, or throat Bleeding--bloody or black, tar-like stools, vomiting blood or brown material that looks like coffee grounds, red or dark brown urine, small red or purple spots on skin, unusual bruising or bleeding Dry cough, shortness of breath or trouble breathing Heart rhythm changes--fast or irregular heartbeat, dizziness, feeling faint or lightheaded, chest pain, trouble breathing Infection--fever, chills, cough, sore throat, wounds that don't heal, pain or trouble when passing urine, general feeling of discomfort or being unwell Liver injury--right upper belly pain, loss of appetite, nausea, light-colored stool, dark yellow or brown urine, yellowing skin or eyes, unusual weakness or fatigue Low red blood cell level--unusual weakness or fatigue, dizziness, headache, trouble breathing Muscle injury--unusual weakness or fatigue, muscle pain, dark yellow or brown urine, decrease in amount of urine Pain, tingling, or numbness in the hands or feet Sudden and severe headache, confusion, change in vision, seizures, which may be signs of posterior reversible encephalopathy syndrome (PRES) Unusual bruising or bleeding Side effects that usually do not require medical attention (report to your care team if   they continue or are bothersome): Diarrhea Nausea Pain, redness, or swelling with sores inside the mouth or throat Unusual weakness or fatigue Vomiting This list may not describe all possible side effects. Call your doctor for medical advice about side effects. You may report side effects to FDA at 1-800-FDA-1088. Where should I keep my medication? This medication is given in a  hospital or clinic. It will not be stored at home. NOTE: This sheet is a summary. It may not cover all possible information. If you have questions about this medicine, talk to your doctor, pharmacist, or health care provider.  2024 Elsevier/Gold Standard (2022-09-04 00:00:00)   Leucovorin Injection What is this medication? LEUCOVORIN (loo koe VOR in) prevents side effects from certain medications, such as methotrexate. It works by increasing folate levels. This helps protect healthy cells in your body. It may also be used to treat anemia caused by low levels of folate. It can also be used with fluorouracil, a type of chemotherapy, to treat colorectal cancer. It works by increasing the effects of fluorouracil in the body. This medicine may be used for other purposes; ask your health care provider or pharmacist if you have questions. What should I tell my care team before I take this medication? They need to know if you have any of these conditions: Anemia from low levels of vitamin B12 in the blood An unusual or allergic reaction to leucovorin, folic acid, other medications, foods, dyes, or preservatives Pregnant or trying to get pregnant Breastfeeding How should I use this medication? This medication is injected into a vein or a muscle. It is given by your care team in a hospital or clinic setting. Talk to your care team about the use of this medication in children. Special care may be needed. Overdosage: If you think you have taken too much of this medicine contact a poison control center or emergency room at once. NOTE: This medicine is only for you. Do not share this medicine with others. What if I miss a dose? Keep appointments for follow-up doses. It is important not to miss your dose. Call your care team if you are unable to keep an appointment. What may interact with this medication? Capecitabine Fluorouracil Phenobarbital Phenytoin Primidone Trimethoprim;sulfamethoxazole This  list may not describe all possible interactions. Give your health care provider a list of all the medicines, herbs, non-prescription drugs, or dietary supplements you use. Also tell them if you smoke, drink alcohol, or use illegal drugs. Some items may interact with your medicine. What should I watch for while using this medication? Your condition will be monitored carefully while you are receiving this medication. This medication may increase the side effects of 5-fluorouracil. Tell your care team if you have diarrhea or mouth sores that do not get better or that get worse. What side effects may I notice from receiving this medication? Side effects that you should report to your care team as soon as possible: Allergic reactions--skin rash, itching, hives, swelling of the face, lips, tongue, or throat This list may not describe all possible side effects. Call your doctor for medical advice about side effects. You may report side effects to FDA at 1-800-FDA-1088. Where should I keep my medication? This medication is given in a hospital or clinic. It will not be stored at home. NOTE: This sheet is a summary. It may not cover all possible information. If you have questions about this medicine, talk to your doctor, pharmacist, or health care provider.  2024 Elsevier/Gold Standard (  2021-11-30 00:00:00)  Fluorouracil Injection What is this medication? FLUOROURACIL (flure oh YOOR a sil) treats some types of cancer. It works by slowing down the growth of cancer cells. This medicine may be used for other purposes; ask your health care provider or pharmacist if you have questions. COMMON BRAND NAME(S): Adrucil What should I tell my care team before I take this medication? They need to know if you have any of these conditions: Blood disorders Dihydropyrimidine dehydrogenase (DPD) deficiency Infection, such as chickenpox, cold sores, herpes Kidney disease Liver disease Poor nutrition Recent or ongoing  radiation therapy An unusual or allergic reaction to fluorouracil, other medications, foods, dyes, or preservatives If you or your partner are pregnant or trying to get pregnant Breast-feeding How should I use this medication? This medication is injected into a vein. It is administered by your care team in a hospital or clinic setting. Talk to your care team about the use of this medication in children. Special care may be needed. Overdosage: If you think you have taken too much of this medicine contact a poison control center or emergency room at once. NOTE: This medicine is only for you. Do not share this medicine with others. What if I miss a dose? Keep appointments for follow-up doses. It is important not to miss your dose. Call your care team if you are unable to keep an appointment. What may interact with this medication? Do not take this medication with any of the following: Live virus vaccines This medication may also interact with the following: Medications that treat or prevent blood clots, such as warfarin, enoxaparin, dalteparin This list may not describe all possible interactions. Give your health care provider a list of all the medicines, herbs, non-prescription drugs, or dietary supplements you use. Also tell them if you smoke, drink alcohol, or use illegal drugs. Some items may interact with your medicine. What should I watch for while using this medication? Your condition will be monitored carefully while you are receiving this medication. This medication may make you feel generally unwell. This is not uncommon as chemotherapy can affect healthy cells as well as cancer cells. Report any side effects. Continue your course of treatment even though you feel ill unless your care team tells you to stop. In some cases, you may be given additional medications to help with side effects. Follow all directions for their use. This medication may increase your risk of getting an infection.  Call your care team for advice if you get a fever, chills, sore throat, or other symptoms of a cold or flu. Do not treat yourself. Try to avoid being around people who are sick. This medication may increase your risk to bruise or bleed. Call your care team if you notice any unusual bleeding. Be careful brushing or flossing your teeth or using a toothpick because you may get an infection or bleed more easily. If you have any dental work done, tell your dentist you are receiving this medication. Avoid taking medications that contain aspirin, acetaminophen, ibuprofen, naproxen, or ketoprofen unless instructed by your care team. These medications may hide a fever. Do not treat diarrhea with over the counter products. Contact your care team if you have diarrhea that lasts more than 2 days or if it is severe and watery. This medication can make you more sensitive to the sun. Keep out of the sun. If you cannot avoid being in the sun, wear protective clothing and sunscreen. Do not use sun lamps, tanning beds, or   tanning booths. Talk to your care team if you or your partner wish to become pregnant or think you might be pregnant. This medication can cause serious birth defects if taken during pregnancy and for 3 months after the last dose. A reliable form of contraception is recommended while taking this medication and for 3 months after the last dose. Talk to your care team about effective forms of contraception. Do not father a child while taking this medication and for 3 months after the last dose. Use a condom while having sex during this time period. Do not breastfeed while taking this medication. This medication may cause infertility. Talk to your care team if you are concerned about your fertility. What side effects may I notice from receiving this medication? Side effects that you should report to your care team as soon as possible: Allergic reactions--skin rash, itching, hives, swelling of the face, lips,  tongue, or throat Heart attack--pain or tightness in the chest, shoulders, arms, or jaw, nausea, shortness of breath, cold or clammy skin, feeling faint or lightheaded Heart failure--shortness of breath, swelling of the ankles, feet, or hands, sudden weight gain, unusual weakness or fatigue Heart rhythm changes--fast or irregular heartbeat, dizziness, feeling faint or lightheaded, chest pain, trouble breathing High ammonia level--unusual weakness or fatigue, confusion, loss of appetite, nausea, vomiting, seizures Infection--fever, chills, cough, sore throat, wounds that don't heal, pain or trouble when passing urine, general feeling of discomfort or being unwell Low red blood cell level--unusual weakness or fatigue, dizziness, headache, trouble breathing Pain, tingling, or numbness in the hands or feet, muscle weakness, change in vision, confusion or trouble speaking, loss of balance or coordination, trouble walking, seizures Redness, swelling, and blistering of the skin over hands and feet Severe or prolonged diarrhea Unusual bruising or bleeding Side effects that usually do not require medical attention (report to your care team if they continue or are bothersome): Dry skin Headache Increased tears Nausea Pain, redness, or swelling with sores inside the mouth or throat Sensitivity to light Vomiting This list may not describe all possible side effects. Call your doctor for medical advice about side effects. You may report side effects to FDA at 1-800-FDA-1088. Where should I keep my medication? This medication is given in a hospital or clinic. It will not be stored at home. NOTE: This sheet is a summary. It may not cover all possible information. If you have questions about this medicine, talk to your doctor, pharmacist, or health care provider.  2024 Elsevier/Gold Standard (2021-11-02 00:00:00)    BELOW ARE SYMPTOMS THAT SHOULD BE REPORTED IMMEDIATELY: *FEVER GREATER THAN 100.4 F (38  C) OR HIGHER *CHILLS OR SWEATING *NAUSEA AND VOMITING THAT IS NOT CONTROLLED WITH YOUR NAUSEA MEDICATION *UNUSUAL SHORTNESS OF BREATH *UNUSUAL BRUISING OR BLEEDING *URINARY PROBLEMS (pain or burning when urinating, or frequent urination) *BOWEL PROBLEMS (unusual diarrhea, constipation, pain near the anus) TENDERNESS IN MOUTH AND THROAT WITH OR WITHOUT PRESENCE OF ULCERS (sore throat, sores in mouth, or a toothache) UNUSUAL RASH, SWELLING OR PAIN  UNUSUAL VAGINAL DISCHARGE OR ITCHING   Items with * indicate a potential emergency and should be followed up as soon as possible or go to the Emergency Department if any problems should occur.  Please show the CHEMOTHERAPY ALERT CARD or IMMUNOTHERAPY ALERT CARD at check-in to the Emergency Department and triage nurse.  Should you have questions after your visit or need to cancel or reschedule your appointment, please contact [facility] [place] [facility] [phone]  and follow   the prompts.  Office hours are 8:00 a.m. to 4:30 p.m. Monday - Friday. Please note that voicemails left after 4:00 p.m. may not be returned until the following business day.  We are closed weekends and major holidays. You have access to a nurse at all times for urgent questions. Please call the main number to the clinic [phone] and follow the prompts.  For any non-urgent questions, you may also contact your provider using MyChart. We now offer e-Visits for anyone 18 and older to request care online for non-urgent symptoms. For details visit [email or web address].   Also download the MyChart app! Go to the app store, search "MyChart", open the app, select [facility], and log in with your MyChart username and password.   

## 2023-01-11 NOTE — Progress Notes (Signed)
Patient presents today for Folfox infusion. Patient is in satisfactory condition with no new complaints voiced.  Vital signs are stable.  Labs reviewed by Dr. Ellin Saba during the office visit and all orther labs are within treatment parameters. Pt's ANC is 1.1 today, Dr.K made aware. Dr.K will be add long-acting GCSF to Day 3 of treatment plan. We will proceed with treatment per MD orders.   Treatment given today per MD orders. Tolerated infusion without adverse affects. Vital signs stable. No complaints at this time. Discharged from clinic ambulatory in stable condition. Alert and oriented x 3. F/U with [facility] as scheduled.  5FU ambulatory pump infusing.

## 2023-01-11 NOTE — Progress Notes (Signed)
Patient has been examined by Dr. Ellin Saba. Vital signs and labs have been reviewed by MD - ANC (1.1), Creatinine, LFTs, hemoglobin, and platelets are within treatment parameters per M.D. - pt may proceed with treatment.  Primary RN and pharmacy notified.

## 2023-01-13 ENCOUNTER — Inpatient Hospital Stay: Payer: 59

## 2023-01-13 VITALS — BP 130/80 | HR 75 | Temp 96.9°F | Resp 18

## 2023-01-13 DIAGNOSIS — R7401 Elevation of levels of liver transaminase levels: Secondary | ICD-10-CM | POA: Diagnosis not present

## 2023-01-13 DIAGNOSIS — Z5189 Encounter for other specified aftercare: Secondary | ICD-10-CM | POA: Diagnosis not present

## 2023-01-13 DIAGNOSIS — R739 Hyperglycemia, unspecified: Secondary | ICD-10-CM | POA: Diagnosis not present

## 2023-01-13 DIAGNOSIS — C20 Malignant neoplasm of rectum: Secondary | ICD-10-CM | POA: Diagnosis not present

## 2023-01-13 DIAGNOSIS — Z5111 Encounter for antineoplastic chemotherapy: Secondary | ICD-10-CM | POA: Diagnosis not present

## 2023-01-13 DIAGNOSIS — Z452 Encounter for adjustment and management of vascular access device: Secondary | ICD-10-CM | POA: Diagnosis not present

## 2023-01-13 DIAGNOSIS — F32A Depression, unspecified: Secondary | ICD-10-CM | POA: Diagnosis not present

## 2023-01-13 MED ORDER — HEPARIN SOD (PORK) LOCK FLUSH 100 UNIT/ML IV SOLN
500.0000 [IU] | Freq: Once | INTRAVENOUS | Status: AC | PRN
  Administered 2023-01-13: 500 [IU]

## 2023-01-13 MED ORDER — SODIUM CHLORIDE 0.9% FLUSH
10.0000 mL | INTRAVENOUS | Status: DC | PRN
  Administered 2023-01-13: 10 mL

## 2023-01-13 NOTE — Progress Notes (Signed)
Michelle Nasuti Stubbe presents to have home infusion pump d/c'd and for port-a-cath deaccess with flush.  Portacath located right chest wall accessed with  H 20 needle.  Good blood return present. Portacath flushed with NS and 500U/26ml Heparin, and needle removed intact.  Procedure tolerated well and without incident. Vitals stable and discharged home from clinic ambulatory. Follow up as scheduled.

## 2023-01-13 NOTE — Patient Instructions (Signed)
[facility]  Discharge Instructions: Thank you for choosing [facility] to provide your oncology and hematology care.  If you have a lab appointment with the Cancer Center - please note that after April 8th, 2024, all labs will be drawn in the cancer center.  You do not have to check in or register with the main entrance as you have in the past but will complete your check-in in the cancer center.  Wear comfortable clothing and clothing appropriate for easy access to any Portacath or PICC line.   We strive to give you quality time with your provider. You may need to reschedule your appointment if you arrive late (15 or more minutes).  Arriving late affects you and other patients whose appointments are after yours.  Also, if you miss three or more appointments without notifying the office, you may be dismissed from the clinic at the provider's discretion.      For prescription refill requests, have your pharmacy contact our office and allow 72 hours for refills to be completed.    Today you had your ambulatory pump disconnected.    To help prevent nausea and vomiting after your treatment, we encourage you to take your nausea medication as directed.  BELOW ARE SYMPTOMS THAT SHOULD BE REPORTED IMMEDIATELY: *FEVER GREATER THAN 100.4 F (38 C) OR HIGHER *CHILLS OR SWEATING *NAUSEA AND VOMITING THAT IS NOT CONTROLLED WITH YOUR NAUSEA MEDICATION *UNUSUAL SHORTNESS OF BREATH *UNUSUAL BRUISING OR BLEEDING *URINARY PROBLEMS (pain or burning when urinating, or frequent urination) *BOWEL PROBLEMS (unusual diarrhea, constipation, pain near the anus) TENDERNESS IN MOUTH AND THROAT WITH OR WITHOUT PRESENCE OF ULCERS (sore throat, sores in mouth, or a toothache) UNUSUAL RASH, SWELLING OR PAIN  UNUSUAL VAGINAL DISCHARGE OR ITCHING   Items with * indicate a potential emergency and should be followed up as soon as possible or go to the Emergency Department if any problems  should occur.  Please show the CHEMOTHERAPY ALERT CARD or IMMUNOTHERAPY ALERT CARD at check-in to the Emergency Department and triage nurse.  Should you have questions after your visit or need to cancel or reschedule your appointment, please contact [facility] [phone]  and follow the prompts.  Office hours are 8:00 a.m. to 4:30 p.m. Monday - Friday. Please note that voicemails left after 4:00 p.m. may not be returned until the following business day.  We are closed weekends and major holidays. You have access to a nurse at all times for urgent questions. Please call the main number to the clinic [phone] and follow the prompts.  For any non-urgent questions, you may also contact your provider using MyChart. We now offer e-Visits for anyone 18 and older to request care online for non-urgent symptoms. For details visit [email or web address].   Also download the MyChart app! Go to the app store, search "MyChart", open the app, select , and log in with your MyChart username and password.   

## 2023-01-16 ENCOUNTER — Other Ambulatory Visit: Payer: Self-pay | Admitting: *Deleted

## 2023-01-16 NOTE — Progress Notes (Addendum)
The following biosimilar Fulphila (pegfilgrastim-jmdb) has been selected for use in this patient.  Requested to add to pump DC per Dr Ellin Saba.    Stephens Shire, [facility], 01/16/2023  3:48 PM

## 2023-01-24 ENCOUNTER — Inpatient Hospital Stay: Payer: 59

## 2023-01-24 ENCOUNTER — Other Ambulatory Visit: Payer: Self-pay

## 2023-01-24 DIAGNOSIS — R739 Hyperglycemia, unspecified: Secondary | ICD-10-CM | POA: Diagnosis not present

## 2023-01-24 DIAGNOSIS — Z452 Encounter for adjustment and management of vascular access device: Secondary | ICD-10-CM | POA: Diagnosis not present

## 2023-01-24 DIAGNOSIS — C20 Malignant neoplasm of rectum: Secondary | ICD-10-CM | POA: Diagnosis not present

## 2023-01-24 DIAGNOSIS — R7401 Elevation of levels of liver transaminase levels: Secondary | ICD-10-CM | POA: Diagnosis not present

## 2023-01-24 DIAGNOSIS — F32A Depression, unspecified: Secondary | ICD-10-CM | POA: Diagnosis not present

## 2023-01-24 DIAGNOSIS — Z5111 Encounter for antineoplastic chemotherapy: Secondary | ICD-10-CM | POA: Diagnosis not present

## 2023-01-24 DIAGNOSIS — D702 Other drug-induced agranulocytosis: Secondary | ICD-10-CM

## 2023-01-24 DIAGNOSIS — Z5189 Encounter for other specified aftercare: Secondary | ICD-10-CM | POA: Diagnosis not present

## 2023-01-24 LAB — MAGNESIUM: Magnesium: 2.1 mg/dL (ref 1.7–2.4)

## 2023-01-24 LAB — CBC WITH DIFFERENTIAL/PLATELET
Abs Immature Granulocytes: 0.01 10*3/uL (ref 0.00–0.07)
Basophils Absolute: 0 10*3/uL (ref 0.0–0.1)
Basophils Relative: 0 %
Eosinophils Absolute: 0.1 10*3/uL (ref 0.0–0.5)
Eosinophils Relative: 2 %
HCT: 35.2 % — ABNORMAL LOW (ref 39.0–52.0)
Hemoglobin: 11.7 g/dL — ABNORMAL LOW (ref 13.0–17.0)
Immature Granulocytes: 0 %
Lymphocytes Relative: 51 %
Lymphs Abs: 1.6 10*3/uL (ref 0.7–4.0)
MCH: 29.5 pg (ref 26.0–34.0)
MCHC: 33.2 g/dL (ref 30.0–36.0)
MCV: 88.7 fL (ref 80.0–100.0)
Monocytes Absolute: 0.4 10*3/uL (ref 0.1–1.0)
Monocytes Relative: 13 %
Neutro Abs: 1.1 10*3/uL — ABNORMAL LOW (ref 1.7–7.7)
Neutrophils Relative %: 34 %
Platelets: 120 10*3/uL — ABNORMAL LOW (ref 150–400)
RBC: 3.97 MIL/uL — ABNORMAL LOW (ref 4.22–5.81)
RDW: 15.9 % — ABNORMAL HIGH (ref 11.5–15.5)
WBC: 3.1 10*3/uL — ABNORMAL LOW (ref 4.0–10.5)
nRBC: 0 % (ref 0.0–0.2)

## 2023-01-24 LAB — COMPREHENSIVE METABOLIC PANEL
ALT: 67 U/L — ABNORMAL HIGH (ref 0–44)
AST: 52 U/L — ABNORMAL HIGH (ref 15–41)
Albumin: 3.8 g/dL (ref 3.5–5.0)
Alkaline Phosphatase: 71 U/L (ref 38–126)
Anion gap: 6 (ref 5–15)
BUN: 14 mg/dL (ref 6–20)
CO2: 25 mmol/L (ref 22–32)
Calcium: 9.1 mg/dL (ref 8.9–10.3)
Chloride: 107 mmol/L (ref 98–111)
Creatinine, Ser: 1.35 mg/dL — ABNORMAL HIGH (ref 0.61–1.24)
GFR, Estimated: >60 mL/min (ref >60)
Glucose, Bld: 117 mg/dL — ABNORMAL HIGH (ref 70–99)
Potassium: 4 mmol/L (ref 3.5–5.1)
Sodium: 138 mmol/L (ref 135–145)
Total Bilirubin: 0.5 mg/dL (ref 0.3–1.2)
Total Protein: 7.4 g/dL (ref 6.5–8.1)

## 2023-01-24 MED ORDER — FILGRASTIM-SNDZ 480 MCG/0.8ML IJ SOSY
480.0000 ug | PREFILLED_SYRINGE | Freq: Once | INTRAMUSCULAR | Status: AC
  Administered 2023-01-24: 480 ug via SUBCUTANEOUS
  Filled 2023-01-24: qty 0.8

## 2023-01-24 MED ORDER — HEPARIN SOD (PORK) LOCK FLUSH 100 UNIT/ML IV SOLN
500.0000 [IU] | Freq: Once | INTRAVENOUS | Status: AC
  Administered 2023-01-24: 500 [IU] via INTRAVENOUS

## 2023-01-24 MED ORDER — SODIUM CHLORIDE 0.9% FLUSH
10.0000 mL | Freq: Once | INTRAVENOUS | Status: AC
  Administered 2023-01-24: 10 mL via INTRAVENOUS

## 2023-01-24 NOTE — Progress Notes (Signed)
[facility] [address] [place] [address]    Clinic Day:  01/24/2023  Referring physician: Marlise Eves, MD  Patient Care Team: Patient, No Pcp Per as PCP - General (General Practice) Doreatha Massed, MD as Medical Oncologist (Medical Oncology) Therese Sarah, RN as Oncology Nurse Navigator (Medical Oncology)   ASSESSMENT & PLAN:   Assessment: 1.  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 - Colonoscopy and biopsy (10/13/2022): Adenocarcinoma.  MMR preserved. - Evaluated at [facility] cancer care. - Pelvic MRI (10/21/2022): A semi-annular ulcerated T3 N1 low rectal tumor with mucinous features sitting at or just above the level of the sphincter complex without demonstrable infiltration of the sphincter complex.  Lymph node measures 5 mm in short axis with minimum distance 5 mm from MRF at 9:00.  Several additional small rounded lymph nodes seen in the vicinity which fall below the threshold.  No demonstrable extra mesorectal pelvic metastatic disease. - Cycle 1 of FOLFOX started on 11/29/2022   2.  Social/family history: - [name]  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  Quit smoking 6 months ago.  Smoked half to 1 pack/day starting at age 71. - Sister had stomach cancer.  Another sister had colon polyps removed.  Maternal aunt had lung cancer.  Maternal great grandfather had stomach cancer.  Paternal grandfather had bone cancer.  Paternal aunt had skin cancer.  Another paternal aunt had breast cancer.  Paternal uncle had lung cancer.    Plan: 1.  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 - He reported rectal bleeding on 01/01/2023 and was evaluated in the ER.  He was prescribed lidocaine and Norco. - He reports cold sensitivity lasting all 2 weeks.  Denies any tingling or numbness  in the extremities. - Reviewed labs today: Normal LFTs.  Creatinine 1.29.  CBC shows white count is 3.2 and ANC is 1.1. - Recommend proceeding with cycle 4 today.  Will add G-CSF on day 3. - Will follow-up on Signatera testing ordered at [facility] cancer care.  Genetic testing was also ordered.   2.  Rectal pain: - Continue tramadol as needed.  He was evaluated in the ER on 01/01/2023 and was given some Norco.    No orders of the defined types were placed in this encounter.     Alben Deeds Teague,acting as a Neurosurgeon for Doreatha Massed, MD.,have documented all relevant documentation on the behalf of Doreatha Massed, MD,as directed by  Doreatha Massed, MD while in the presence of Doreatha Massed, MD.  ***   [place] R Teague   7/16/20248:09 PM  CHIEF COMPLAINT:   Diagnosis: rectal adenocarcinoma    Cancer Staging  Rectal adenocarcinoma [facility]) Staging form: Colon and Rectum, AJCC 8th Edition - Clinical stage from 11/18/2022: Stage IIIB (cT3, cN1b, cM0) - Unsigned    Prior Therapy: none  Current Therapy:  neoadjuvant FOLFOX    HISTORY OF PRESENT ILLNESS:   Oncology History  Rectal adenocarcinoma (HCC)  11/18/2022 Initial Diagnosis   Rectal adenocarcinoma (HCC)   11/29/2022 -  Chemotherapy   Patient is on Treatment Plan : COLORECTAL FOLFOX q14d x 4 months     01/04/2023 Genetic Testing   Negative genetic testing on the CancerNext-Expanded+RNAinsight.  CDH1 p.V495A (c.1484T>C) VUS was found.  The report date is January 04, 2023.  The CancerNext-Expanded gene panel offered by [facility] and includes sequencing and rearrangement analysis for the following 77 genes: AIP, ALK, APC*, ATM*, AXIN2, BAP1, BARD1, BMPR1A, BRCA1*, BRCA2*, BRIP1*, CDC73, CDH1*, CDK4, CDKN1B, CDKN2A, CHEK2*, CTNNA1, DICER1, FH, FLCN, KIF1B, LZTR1, MAX, MEN1, MET, MLH1*, MSH2*, MSH3, MSH6*, MUTYH*, NF1*, NF2, NTHL1, PALB2*, PHOX2B, PMS2*, POT1, PRKAR1A, PTCH1, PTEN*, RAD51C*, RAD51D*, RB1, RET,  SDHA, SDHAF2, SDHB, SDHC, SDHD, SMAD4, SMARCA4, SMARCB1, SMARCE1, STK11, SUFU, TMEM127, TP53*, TSC1, TSC2, and VHL (sequencing and deletion/duplication); EGFR, EGLN1, HOXB13, KIT, MITF, PDGFRA, POLD1, and POLE (sequencing only); EPCAM and GREM1 (deletion/duplication only). DNA and RNA analyses performed for * genes.       INTERVAL HISTORY:   Lamarr is a 47 y.o. male presenting to clinic today for follow up of rectal adenocarcinoma. He was last seen by me on 01/11/23.  His most recent CMP from 7/16 found elevated glucose at 117, elevated creatinine at 1.35, elevated AST at 52, and elevated ALT at 67. His most recent CBC from 7/16 found decreased WBC at 3.2, decreased hemoglobin at 12.1, decreased HCT at 37.0, decreased platelets at 146, and decreased neutro abs at 1.1.   Today, he states that he is doing well overall. His appetite level is at ***%. His energy level is at ***%.  PAST MEDICAL HISTORY:   Past Medical History: Past Medical History:  Diagnosis Date   Cancer (HCC)    Family history of breast cancer    Family history of stomach cancer     Surgical History: Past Surgical History:  Procedure Laterality Date   ANKLE FRACTURE SURGERY     FOOT SURGERY     IR IMAGING GUIDED PORT INSERTION  11/25/2022    Social History: Social History   Socioeconomic History   Marital status: Divorced    Spouse name: Not on file   Number of children: Not on file   Years of education: Not on file   Highest education level: Not on file  Occupational History   Not on file  Tobacco Use   Smoking status: Some Days    Types: Cigars    Last attempt to quit: 06/10/2022    Years since quitting: 0.6   Smokeless tobacco: Never  Vaping Use   Vaping status: Some Days  Substance and Sexual Activity   Alcohol use: Yes   Drug use: Not Currently   Sexual activity: Not on file  Other Topics Concern   Not on file  Social History Narrative   ** Merged History Encounter **       Social Determinants  of Health   Financial Resource Strain: High Risk (11/28/2022)   Overall Financial Resource Strain (CARDIA)    Difficulty of Paying Living Expenses: Very hard  Food Insecurity: No Food Insecurity (11/22/2022)   Hunger Vital Sign    Worried About Running Out of Food in the Last Year: Never true    Ran Out of Food in the Last Year: Never true  Transportation Needs: No Transportation Needs (11/22/2022)   PRAPARE - Administrator, Civil Service (Medical): No    Lack of Transportation (Non-Medical): No  Physical Activity: Not on file  Stress: Not on file  Social Connections: Not on file  Intimate Partner Violence: Not At Risk (11/22/2022)   Humiliation, Afraid, Rape, and Kick questionnaire    Fear of Current or Ex-Partner: No    Emotionally Abused: No    Physically Abused: No    Sexually Abused: No  Family History: Family History  Problem Relation Age of Onset   Stomach cancer Sister 62 - 34   Other Sister        ovary surgery   Cancer Maternal Aunt        NOS   Breast cancer Paternal Aunt        dx.>50   Melanoma Paternal Aunt 70 - 59   Aneurysm Paternal Grandmother    Bone cancer Paternal Grandfather        dx.>50   Cancer Cousin 56       pat first cousin with NOS cancer    Current Medications:  Current Outpatient Medications:    alum & mag hydroxide-simeth (MAALOX/MYLANTA) 200-200-20 MG/5ML suspension, Take 10 mLs by mouth every 6 (six) hours as needed for indigestion or heartburn., Disp: 300 mL, Rfl: 1   dextrose 5 % SOLN 1,000 mL with fluorouracil 5 GM/100ML SOLN, Inject into the vein over 48 hr. Every 14 days, Disp: , Rfl:    FLUOROURACIL IV, Inject into the vein every 14 (fourteen) days., Disp: , Rfl:    HYDROcodone-acetaminophen (NORCO/VICODIN) 5-325 MG tablet, Take 2 tablets by mouth every 6 (six) hours as needed for severe pain., Disp: 12 tablet, Rfl: 0   LEUCOVORIN CALCIUM IV, Inject into the vein every 14 (fourteen) days., Disp: , Rfl:    lidocaine  (XYLOCAINE) 2 % solution, Use as directed 15 mLs in the mouth or throat as needed for mouth pain., Disp: 10 mL, Rfl: 1   Lidocaine 3 % CREA, Apply 1 Application topically 3 (three) times daily as needed., Disp: 28.35 g, Rfl: 0   lidocaine-prilocaine (EMLA) cream, Apply a quarter-sized amount to port a cath site and cover with plastic wrap 1 hour prior to infusion appointments, Disp: 30 g, Rfl: 3   OXALIPLATIN IV, Inject into the vein every 14 (fourteen) days., Disp: , Rfl:    prochlorperazine (COMPAZINE) 10 MG tablet, Take 1 tablet (10 mg total) by mouth every 6 (six) hours as needed for nausea or vomiting., Disp: 60 tablet, Rfl: 4   Allergies: Allergies  Allergen Reactions   Bee Venom     REVIEW OF SYSTEMS:   Review of Systems  Constitutional:  Negative for chills, fatigue and fever.  HENT:   Negative for lump/mass, mouth sores, nosebleeds, sore throat and trouble swallowing.   Eyes:  Negative for eye problems.  Respiratory:  Negative for cough and shortness of breath.   Cardiovascular:  Negative for chest pain, leg swelling and palpitations.  Gastrointestinal:  Negative for abdominal pain, constipation, diarrhea, nausea and vomiting.  Genitourinary:  Negative for bladder incontinence, difficulty urinating, dysuria, frequency, hematuria and nocturia.   Musculoskeletal:  Negative for arthralgias, back pain, flank pain, myalgias and neck pain.  Skin:  Negative for itching and rash.  Neurological:  Negative for dizziness, headaches and numbness.  Hematological:  Does not bruise/bleed easily.  Psychiatric/Behavioral:  Negative for depression, sleep disturbance and suicidal ideas. The patient is not nervous/anxious.   All other systems reviewed and are negative.    VITALS:   There were no vitals taken for this visit.  Wt Readings from Last 3 Encounters:  01/11/23 202 lb 6.4 oz (91.8 kg)  01/01/23 202 lb 9.6 oz (91.9 kg)  12/26/22 202 lb 9.6 oz (91.9 kg)    There is no height or  weight on file to calculate BMI.  Performance status (ECOG): 1 - Symptomatic but completely ambulatory  PHYSICAL EXAM:   Physical Exam Vitals and  nursing note reviewed. Exam conducted with a chaperone present.  Constitutional:      Appearance: Normal appearance.  Cardiovascular:     Rate and Rhythm: Normal rate and regular rhythm.     Pulses: Normal pulses.     Heart sounds: Normal heart sounds.  Pulmonary:     Effort: Pulmonary effort is normal.     Breath sounds: Normal breath sounds.  Abdominal:     Palpations: Abdomen is soft. There is no hepatomegaly, splenomegaly or mass.     Tenderness: There is no abdominal tenderness.  Musculoskeletal:     Right lower leg: No edema.     Left lower leg: No edema.  Lymphadenopathy:     Cervical: No cervical adenopathy.     Right cervical: No superficial, deep or posterior cervical adenopathy.    Left cervical: No superficial, deep or posterior cervical adenopathy.     Upper Body:     Right upper body: No supraclavicular or axillary adenopathy.     Left upper body: No supraclavicular or axillary adenopathy.  Neurological:     General: No focal deficit present.     Mental Status: He is alert and oriented to person, place, and time.  Psychiatric:        Mood and Affect: Mood normal.        Behavior: Behavior normal.     LABS:      Latest Ref Rng & Units 01/24/2023    1:18 PM 01/11/2023    9:26 AM 12/26/2022    9:14 AM  CBC  WBC 4.0 - 10.5 K/uL 3.1  3.2  3.3   Hemoglobin 13.0 - 17.0 g/dL 41.3  24.4  01.0   Hematocrit 39.0 - 52.0 % 35.2  37.0  34.3   Platelets 150 - 400 K/uL 120  146  131       Latest Ref Rng & Units 01/24/2023    1:18 PM 01/11/2023    9:26 AM 12/26/2022    9:14 AM  CMP  Glucose 70 - 99 mg/dL 272  536  644   BUN 6 - 20 mg/dL 14  13  13    Creatinine 0.61 - 1.24 mg/dL 0.34  7.42  5.95   Sodium 135 - 145 mmol/L 138  136  137   Potassium 3.5 - 5.1 mmol/L 4.0  3.8  3.5   Chloride 98 - 111 mmol/L 107  106  106    CO2 22 - 32 mmol/L 25  24  25    Calcium 8.9 - 10.3 mg/dL 9.1  8.7  8.5   Total Protein 6.5 - 8.1 g/dL 7.4  7.2  6.9   Total Bilirubin 0.3 - 1.2 mg/dL 0.5  0.8  0.6   Alkaline Phos 38 - 126 U/L 71  61  57   AST 15 - 41 U/L 52  24  25   ALT 0 - 44 U/L 67  21  20      Lab Results  Component Value Date   CEA1 3.1 11/29/2022   /  CEA  Date Value Ref Range Status  11/29/2022 3.1 0.0 - 4.7 ng/mL Final    Comment:    (NOTE)                             Nonsmokers          <3.9  Smokers             <5.6 [employer] Electrochemiluminescence Immunoassay (ECLIA) Values obtained with different assay methods or kits cannot be used interchangeably.  Results cannot be interpreted as absolute evidence of the presence or absence of malignant disease. Performed At: [facility] [address] [place], [place] [id number] Jolene Schimke MD CZ:[id number]    No results found for: "PSA1" No results found for: "[id number]" No results found for: "CAN125"  No results found for: "TOTALPROTELP", "ALBUMINELP", "A1GS", "A2GS", "BETS", "BETA2SER", "GAMS", "MSPIKE", "SPEI" No results found for: "TIBC", "FERRITIN", "IRONPCTSAT" No results found for: "LDH"   STUDIES:   No results found.

## 2023-01-24 NOTE — Patient Instructions (Signed)
[facility]  Discharge Instructions: Thank you for choosing [facility] to provide your oncology and hematology care.  If you have a lab appointment with the Cancer Center - please note that after April 8th, 2024, all labs will be drawn in the cancer center.  You do not have to check in or register with the main entrance as you have in the past but will complete your check-in in the cancer center.  Wear comfortable clothing and clothing appropriate for easy access to any Portacath or PICC line.   We strive to give you quality time with your provider. You may need to reschedule your appointment if you arrive late (15 or more minutes).  Arriving late affects you and other patients whose appointments are after yours.  Also, if you miss three or more appointments without notifying the office, you may be dismissed from the clinic at the provider's discretion.      For prescription refill requests, have your pharmacy contact our office and allow 72 hours for refills to be completed.    Today you received the following chemotherapy and/or immunotherapy agents zarxio.    Filgrastim Injection What is this medication? FILGRASTIM (fil GRA stim) lowers the risk of infection in people who are receiving chemotherapy. It works by Systems analyst make more white blood cells, which protects your body from infection. It may also be used to help people who have been exposed to high doses of radiation. It can be used to help prepare your body before a stem cell transplant. It works by helping your bone marrow make and release stem cells into the blood. This medicine may be used for other purposes; ask your health care provider or pharmacist if you have questions. COMMON BRAND NAME(S): Neupogen, Nivestym, Releuko, Zarxio What should I tell my care team before I take this medication? They need to know if you have any of these conditions: History of blood diseases, such as sickle  cell anemia Kidney disease Recent or ongoing radiation An unusual or allergic reaction to filgrastim, pegfilgrastim, latex, rubber, other medications, foods, dyes, or preservatives Pregnant or trying to get pregnant Breast-feeding How should I use this medication? This medication is injected under the skin or into a vein. It is usually given by your care team in a hospital or clinic setting. It may be given at home. If you get this medication at home, you will be taught how to prepare and give it. Use exactly as directed. Take it as directed on the prescription label at the same time every day. Keep taking it unless your care team tells you to stop. It is important that you put your used needles and syringes in a special sharps container. Do not put them in a trash can. If you do not have a sharps container, call your pharmacist or care team to get one. This medication comes with INSTRUCTIONS FOR USE. Ask your pharmacist for directions on how to use this medication. Read the information carefully. Talk to your pharmacist or care team if you have questions. Talk to your care team about the use of this medication in children. While it may be prescribed for children for selected conditions, precautions do apply. Overdosage: If you think you have taken too much of this medicine contact a poison control center or emergency room at once. NOTE: This medicine is only for you. Do not share this medicine with others. What if I miss a dose? It is important  not to miss any doses. Talk to your care team about what to do if you miss a dose. What may interact with this medication? Medications that may cause a release of neutrophils, such as lithium This list may not describe all possible interactions. Give your health care provider a list of all the medicines, herbs, non-prescription drugs, or dietary supplements you use. Also tell them if you smoke, drink alcohol, or use illegal drugs. Some items may interact  with your medicine. What should I watch for while using this medication? Your condition will be monitored carefully while you are receiving this medication. You may need bloodwork while taking this medication. Talk to your care team about your risk of cancer. You may be more at risk for certain types of cancer if you take this medication. What side effects may I notice from receiving this medication? Side effects that you should report to your care team as soon as possible: Allergic reactions--skin rash, itching, hives, swelling of the face, lips, tongue, or throat Capillary leak syndrome--stomach or muscle pain, unusual weakness or fatigue, feeling faint or lightheaded, decrease in the amount of urine, swelling of the ankles, hands, or feet, trouble breathing High white blood cell level--fever, fatigue, trouble breathing, night sweats, change in vision, weight loss Inflammation of the aorta--fever, fatigue, back, chest, or stomach pain, severe headache Kidney injury (glomerulonephritis)--decrease in the amount of urine, red or dark brown urine, foamy or bubbly urine, swelling of the ankles, hands, or feet Shortness of breath or trouble breathing Spleen injury--pain in upper left stomach or shoulder Unusual bruising or bleeding Side effects that usually do not require medical attention (report to your care team if they continue or are bothersome): Back pain Bone pain Fatigue Fever Headache Nausea This list may not describe all possible side effects. Call your doctor for medical advice about side effects. You may report side effects to FDA at 1-800-FDA-1088. Where should I keep my medication? Keep out of the reach of children and pets. Keep this medication in the original packaging until you are ready to take it. Protect from light. See product for storage information. Each product may have different instructions. Get rid of any unused medication after the expiration date. To get rid of  medications that are no longer needed or have expired: Take the medication to a medications take-back program. Check with your pharmacy or law enforcement to find a location. If you cannot return the medication, ask your pharmacist or care team how to get rid of this medication safely. NOTE: This sheet is a summary. It may not cover all possible information. If you have questions about this medicine, talk to your doctor, pharmacist, or health care provider.  2024 Elsevier/Gold Standard (2021-11-18 00:00:00)       To help prevent nausea and vomiting after your treatment, we encourage you to take your nausea medication as directed.  BELOW ARE SYMPTOMS THAT SHOULD BE REPORTED IMMEDIATELY: *FEVER GREATER THAN 100.4 F (38 C) OR HIGHER *CHILLS OR SWEATING *NAUSEA AND VOMITING THAT IS NOT CONTROLLED WITH YOUR NAUSEA MEDICATION *UNUSUAL SHORTNESS OF BREATH *UNUSUAL BRUISING OR BLEEDING *URINARY PROBLEMS (pain or burning when urinating, or frequent urination) *BOWEL PROBLEMS (unusual diarrhea, constipation, pain near the anus) TENDERNESS IN MOUTH AND THROAT WITH OR WITHOUT PRESENCE OF ULCERS (sore throat, sores in mouth, or a toothache) UNUSUAL RASH, SWELLING OR PAIN  UNUSUAL VAGINAL DISCHARGE OR ITCHING   Items with * indicate a potential emergency and should be followed up as soon as  possible or go to the Emergency Department if any problems should occur.  Please show the CHEMOTHERAPY ALERT CARD or IMMUNOTHERAPY ALERT CARD at check-in to the Emergency Department and triage nurse.  Should you have questions after your visit or need to cancel or reschedule your appointment, please contact [facility] AT [facility] [phone]  and follow the prompts.  Office hours are 8:00 a.m. to 4:30 p.m. Monday - Friday. Please note that voicemails left after 4:00 p.m. may not be returned until the following business day.  We are closed weekends and major holidays. You have access to a nurse at all  times for urgent questions. Please call the main number to the clinic [phone] and follow the prompts.  For any non-urgent questions, you may also contact your provider using MyChart. We now offer e-Visits for anyone 42 and older to request care online for non-urgent symptoms. For details visit [email or web address].   Also download the MyChart app! Go to the app store, search "MyChart", open the app, select [place], and log in with your MyChart username and password.

## 2023-01-24 NOTE — Progress Notes (Signed)
Patinet's ANC 1.1 today.  Zarxio 480 mcg today verbal order Dr. Ellin Saba for [facility] and tomorrow's treatment.  Recheck labs before treatment.   Patient tolerated injection with no complaints voiced.  Site clean and dry with no bruising or swelling noted at site.  See MAR for details.  Band aid applied.  Patient stable during and after injection.  Vss with discharge and left in satisfactory condition with no s/s of distress noted.

## 2023-01-25 ENCOUNTER — Inpatient Hospital Stay (HOSPITAL_BASED_OUTPATIENT_CLINIC_OR_DEPARTMENT_OTHER): Payer: 59 | Admitting: Hematology

## 2023-01-25 ENCOUNTER — Inpatient Hospital Stay: Payer: 59

## 2023-01-25 VITALS — BP 108/64 | HR 80 | Temp 97.4°F | Resp 17

## 2023-01-25 DIAGNOSIS — C20 Malignant neoplasm of rectum: Secondary | ICD-10-CM

## 2023-01-25 DIAGNOSIS — Z5111 Encounter for antineoplastic chemotherapy: Secondary | ICD-10-CM | POA: Diagnosis not present

## 2023-01-25 DIAGNOSIS — R7401 Elevation of levels of liver transaminase levels: Secondary | ICD-10-CM | POA: Diagnosis not present

## 2023-01-25 DIAGNOSIS — R739 Hyperglycemia, unspecified: Secondary | ICD-10-CM | POA: Diagnosis not present

## 2023-01-25 DIAGNOSIS — Z452 Encounter for adjustment and management of vascular access device: Secondary | ICD-10-CM | POA: Diagnosis not present

## 2023-01-25 DIAGNOSIS — F32A Depression, unspecified: Secondary | ICD-10-CM | POA: Diagnosis not present

## 2023-01-25 DIAGNOSIS — Z5189 Encounter for other specified aftercare: Secondary | ICD-10-CM | POA: Diagnosis not present

## 2023-01-25 LAB — CBC WITH DIFFERENTIAL/PLATELET
Abs Immature Granulocytes: 0.11 10*3/uL — ABNORMAL HIGH (ref 0.00–0.07)
Basophils Absolute: 0 10*3/uL (ref 0.0–0.1)
Basophils Relative: 0 %
Eosinophils Absolute: 0.1 10*3/uL (ref 0.0–0.5)
Eosinophils Relative: 1 %
HCT: 35.4 % — ABNORMAL LOW (ref 39.0–52.0)
Hemoglobin: 11.7 g/dL — ABNORMAL LOW (ref 13.0–17.0)
Immature Granulocytes: 1 %
Lymphocytes Relative: 16 %
Lymphs Abs: 1.6 10*3/uL (ref 0.7–4.0)
MCH: 29.4 pg (ref 26.0–34.0)
MCHC: 33.1 g/dL (ref 30.0–36.0)
MCV: 88.9 fL (ref 80.0–100.0)
Monocytes Absolute: 0.9 10*3/uL (ref 0.1–1.0)
Monocytes Relative: 9 %
Neutro Abs: 7.3 10*3/uL (ref 1.7–7.7)
Neutrophils Relative %: 73 %
Platelets: 100 10*3/uL — ABNORMAL LOW (ref 150–400)
RBC: 3.98 MIL/uL — ABNORMAL LOW (ref 4.22–5.81)
RDW: 16.2 % — ABNORMAL HIGH (ref 11.5–15.5)
WBC: 10 10*3/uL (ref 4.0–10.5)
nRBC: 0 % (ref 0.0–0.2)

## 2023-01-25 MED ORDER — FLUOROURACIL CHEMO INJECTION 2.5 GM/50ML
320.0000 mg/m2 | Freq: Once | INTRAVENOUS | Status: AC
  Administered 2023-01-25: 700 mg via INTRAVENOUS
  Filled 2023-01-25: qty 14

## 2023-01-25 MED ORDER — LEUCOVORIN CALCIUM INJECTION 350 MG
320.0000 mg/m2 | Freq: Once | INTRAVENOUS | Status: AC
  Administered 2023-01-25: 698 mg via INTRAVENOUS
  Filled 2023-01-25: qty 34.9

## 2023-01-25 MED ORDER — PALONOSETRON HCL INJECTION 0.25 MG/5ML
0.2500 mg | Freq: Once | INTRAVENOUS | Status: AC
  Administered 2023-01-25: 0.25 mg via INTRAVENOUS
  Filled 2023-01-25: qty 5

## 2023-01-25 MED ORDER — SODIUM CHLORIDE 0.9 % IV SOLN
1920.0000 mg/m2 | INTRAVENOUS | Status: DC
  Administered 2023-01-25: 4200 mg via INTRAVENOUS
  Filled 2023-01-25: qty 84

## 2023-01-25 MED ORDER — SODIUM CHLORIDE 0.9 % IV SOLN
10.0000 mg | Freq: Once | INTRAVENOUS | Status: AC
  Administered 2023-01-25: 10 mg via INTRAVENOUS
  Filled 2023-01-25: qty 10

## 2023-01-25 MED ORDER — DEXTROSE 5 % IV SOLN: Freq: Once | INTRAVENOUS | Status: AC

## 2023-01-25 MED ORDER — OXALIPLATIN CHEMO INJECTION 100 MG/20ML
68.0000 mg/m2 | Freq: Once | INTRAVENOUS | Status: AC
  Administered 2023-01-25: 150 mg via INTRAVENOUS
  Filled 2023-01-25: qty 20

## 2023-01-25 NOTE — Progress Notes (Signed)
Patient has been examined by Dr. Katragadda. Vital signs and labs have been reviewed by MD - ANC, Creatinine, LFTs, hemoglobin, and platelets are within treatment parameters per M.D. - pt may proceed with treatment.  Primary RN and pharmacy notified.  

## 2023-01-25 NOTE — Patient Instructions (Signed)

## 2023-01-25 NOTE — Patient Instructions (Signed)
[facility]  Discharge Instructions: Thank you for choosing [facility] to provide your oncology and hematology care.  If you have a lab appointment with the Cancer Center - please note that after April 8th, 2024, all labs will be drawn in the cancer center.  You do not have to check in or register with the main entrance as you have in the past but will complete your check-in in the cancer center.  Wear comfortable clothing and clothing appropriate for easy access to any Portacath or PICC line.   We strive to give you quality time with your provider. You may need to reschedule your appointment if you arrive late (15 or more minutes).  Arriving late affects you and other patients whose appointments are after yours.  Also, if you miss three or more appointments without notifying the office, you may be dismissed from the clinic at the provider's discretion.      For prescription refill requests, have your pharmacy contact our office and allow 72 hours for refills to be completed.    Today you received the following chemotherapy and/or immunotherapy agents Oxaliplatin/Leucovorin/5FU.  Oxaliplatin Injection What is this medication? OXALIPLATIN (ox AL i PLA tin) treats colorectal cancer. It works by slowing down the growth of cancer cells. This medicine may be used for other purposes; ask your health care provider or pharmacist if you have questions. COMMON BRAND NAME(S): Eloxatin What should I tell my care team before I take this medication? They need to know if you have any of these conditions: Heart disease History of irregular heartbeat or rhythm Liver disease Low blood cell levels (white cells, red cells, and platelets) Lung or breathing disease, such as asthma Take medications that treat or prevent blood clots Tingling of the fingers, toes, or other nerve disorder An unusual or allergic reaction to oxaliplatin, other medications, foods, dyes, or  preservatives If you or your partner are pregnant or trying to get pregnant Breast-feeding How should I use this medication? This medication is injected into a vein. It is given by your care team in a hospital or clinic setting. Talk to your care team about the use of this medication in children. Special care may be needed. Overdosage: If you think you have taken too much of this medicine contact a poison control center or emergency room at once. NOTE: This medicine is only for you. Do not share this medicine with others. What if I miss a dose? Keep appointments for follow-up doses. It is important not to miss a dose. Call your care team if you are unable to keep an appointment. What may interact with this medication? Do not take this medication with any of the following: Cisapride Dronedarone Pimozide Thioridazine This medication may also interact with the following: Aspirin and aspirin-like medications Certain medications that treat or prevent blood clots, such as warfarin, apixaban, dabigatran, and rivaroxaban Cisplatin Cyclosporine Diuretics Medications for infection, such as acyclovir, adefovir, amphotericin B, bacitracin, cidofovir, foscarnet, ganciclovir, gentamicin, pentamidine, vancomycin NSAIDs, medications for pain and inflammation, such as ibuprofen or naproxen Other medications that cause heart rhythm changes Pamidronate Zoledronic acid This list may not describe all possible interactions. Give your health care provider a list of all the medicines, herbs, non-prescription drugs, or dietary supplements you use. Also tell them if you smoke, drink alcohol, or use illegal drugs. Some items may interact with your medicine. What should I watch for while using this medication? Your condition will be monitored carefully while  you are receiving this medication. You may need blood work while taking this medication. This medication may make you feel generally unwell. This is not  uncommon as chemotherapy can affect healthy cells as well as cancer cells. Report any side effects. Continue your course of treatment even though you feel ill unless your care team tells you to stop. This medication may increase your risk of getting an infection. Call your care team for advice if you get a fever, chills, sore throat, or other symptoms of a cold or flu. Do not treat yourself. Try to avoid being around people who are sick. Avoid taking medications that contain aspirin, acetaminophen, ibuprofen, naproxen, or ketoprofen unless instructed by your care team. These medications may hide a fever. Be careful brushing or flossing your teeth or using a toothpick because you may get an infection or bleed more easily. If you have any dental work done, tell your dentist you are receiving this medication. This medication can make you more sensitive to cold. Do not drink cold drinks or use ice. Cover exposed skin before coming in contact with cold temperatures or cold objects. When out in cold weather wear warm clothing and cover your mouth and nose to warm the air that goes into your lungs. Tell your care team if you get sensitive to the cold. Talk to your care team if you or your partner are pregnant or think either of you might be pregnant. This medication can cause serious birth defects if taken during pregnancy and for 9 months after the last dose. A negative pregnancy test is required before starting this medication. A reliable form of contraception is recommended while taking this medication and for 9 months after the last dose. Talk to your care team about effective forms of contraception. Do not father a child while taking this medication and for 6 months after the last dose. Use a condom while having sex during this time period. Do not breastfeed while taking this medication and for 3 months after the last dose. This medication may cause infertility. Talk to your care team if you are concerned about  your fertility. What side effects may I notice from receiving this medication? Side effects that you should report to your care team as soon as possible: Allergic reactions--skin rash, itching, hives, swelling of the face, lips, tongue, or throat Bleeding--bloody or black, tar-like stools, vomiting blood or brown material that looks like coffee grounds, red or dark brown urine, small red or purple spots on skin, unusual bruising or bleeding Dry cough, shortness of breath or trouble breathing Heart rhythm changes--fast or irregular heartbeat, dizziness, feeling faint or lightheaded, chest pain, trouble breathing Infection--fever, chills, cough, sore throat, wounds that don't heal, pain or trouble when passing urine, general feeling of discomfort or being unwell Liver injury--right upper belly pain, loss of appetite, nausea, light-colored stool, dark yellow or brown urine, yellowing skin or eyes, unusual weakness or fatigue Low red blood cell level--unusual weakness or fatigue, dizziness, headache, trouble breathing Muscle injury--unusual weakness or fatigue, muscle pain, dark yellow or brown urine, decrease in amount of urine Pain, tingling, or numbness in the hands or feet Sudden and severe headache, confusion, change in vision, seizures, which may be signs of posterior reversible encephalopathy syndrome (PRES) Unusual bruising or bleeding Side effects that usually do not require medical attention (report to your care team if they continue or are bothersome): Diarrhea Nausea Pain, redness, or swelling with sores inside the mouth or throat Unusual weakness or  fatigue Vomiting This list may not describe all possible side effects. Call your doctor for medical advice about side effects. You may report side effects to FDA at 1-800-FDA-1088. Where should I keep my medication? This medication is given in a hospital or clinic. It will not be stored at home. NOTE: This sheet is a summary. It may not  cover all possible information. If you have questions about this medicine, talk to your doctor, pharmacist, or health care provider.  2024 Elsevier/Gold Standard (2022-09-04 00:00:00)    Leucovorin Injection What is this medication? LEUCOVORIN (loo koe VOR in) prevents side effects from certain medications, such as methotrexate. It works by increasing folate levels. This helps protect healthy cells in your body. It may also be used to treat anemia caused by low levels of folate. It can also be used with fluorouracil, a type of chemotherapy, to treat colorectal cancer. It works by increasing the effects of fluorouracil in the body. This medicine may be used for other purposes; ask your health care provider or pharmacist if you have questions. What should I tell my care team before I take this medication? They need to know if you have any of these conditions: Anemia from low levels of vitamin B12 in the blood An unusual or allergic reaction to leucovorin, folic acid, other medications, foods, dyes, or preservatives Pregnant or trying to get pregnant Breastfeeding How should I use this medication? This medication is injected into a vein or a muscle. It is given by your care team in a hospital or clinic setting. Talk to your care team about the use of this medication in children. Special care may be needed. Overdosage: If you think you have taken too much of this medicine contact a poison control center or emergency room at once. NOTE: This medicine is only for you. Do not share this medicine with others. What if I miss a dose? Keep appointments for follow-up doses. It is important not to miss your dose. Call your care team if you are unable to keep an appointment. What may interact with this medication? Capecitabine Fluorouracil Phenobarbital Phenytoin Primidone Trimethoprim;sulfamethoxazole This list may not describe all possible interactions. Give your health care provider a list of all  the medicines, herbs, non-prescription drugs, or dietary supplements you use. Also tell them if you smoke, drink alcohol, or use illegal drugs. Some items may interact with your medicine. What should I watch for while using this medication? Your condition will be monitored carefully while you are receiving this medication. This medication may increase the side effects of 5-fluorouracil. Tell your care team if you have diarrhea or mouth sores that do not get better or that get worse. What side effects may I notice from receiving this medication? Side effects that you should report to your care team as soon as possible: Allergic reactions--skin rash, itching, hives, swelling of the face, lips, tongue, or throat This list may not describe all possible side effects. Call your doctor for medical advice about side effects. You may report side effects to FDA at 1-800-FDA-1088. Where should I keep my medication? This medication is given in a hospital or clinic. It will not be stored at home. NOTE: This sheet is a summary. It may not cover all possible information. If you have questions about this medicine, talk to your doctor, pharmacist, or health care provider.  2024 Elsevier/Gold Standard (2021-11-30 00:00:00)    Fluorouracil Injection What is this medication? FLUOROURACIL (flure oh YOOR a sil) treats some types  of cancer. It works by slowing down the growth of cancer cells. This medicine may be used for other purposes; ask your health care provider or pharmacist if you have questions. COMMON BRAND NAME(S): Adrucil What should I tell my care team before I take this medication? They need to know if you have any of these conditions: Blood disorders Dihydropyrimidine dehydrogenase (DPD) deficiency Infection, such as chickenpox, cold sores, herpes Kidney disease Liver disease Poor nutrition Recent or ongoing radiation therapy An unusual or allergic reaction to fluorouracil, other medications,  foods, dyes, or preservatives If you or your partner are pregnant or trying to get pregnant Breast-feeding How should I use this medication? This medication is injected into a vein. It is administered by your care team in a hospital or clinic setting. Talk to your care team about the use of this medication in children. Special care may be needed. Overdosage: If you think you have taken too much of this medicine contact a poison control center or emergency room at once. NOTE: This medicine is only for you. Do not share this medicine with others. What if I miss a dose? Keep appointments for follow-up doses. It is important not to miss your dose. Call your care team if you are unable to keep an appointment. What may interact with this medication? Do not take this medication with any of the following: Live virus vaccines This medication may also interact with the following: Medications that treat or prevent blood clots, such as warfarin, enoxaparin, dalteparin This list may not describe all possible interactions. Give your health care provider a list of all the medicines, herbs, non-prescription drugs, or dietary supplements you use. Also tell them if you smoke, drink alcohol, or use illegal drugs. Some items may interact with your medicine. What should I watch for while using this medication? Your condition will be monitored carefully while you are receiving this medication. This medication may make you feel generally unwell. This is not uncommon as chemotherapy can affect healthy cells as well as cancer cells. Report any side effects. Continue your course of treatment even though you feel ill unless your care team tells you to stop. In some cases, you may be given additional medications to help with side effects. Follow all directions for their use. This medication may increase your risk of getting an infection. Call your care team for advice if you get a fever, chills, sore throat, or other  symptoms of a cold or flu. Do not treat yourself. Try to avoid being around people who are sick. This medication may increase your risk to bruise or bleed. Call your care team if you notice any unusual bleeding. Be careful brushing or flossing your teeth or using a toothpick because you may get an infection or bleed more easily. If you have any dental work done, tell your dentist you are receiving this medication. Avoid taking medications that contain aspirin, acetaminophen, ibuprofen, naproxen, or ketoprofen unless instructed by your care team. These medications may hide a fever. Do not treat diarrhea with over the counter products. Contact your care team if you have diarrhea that lasts more than 2 days or if it is severe and watery. This medication can make you more sensitive to the sun. Keep out of the sun. If you cannot avoid being in the sun, wear protective clothing and sunscreen. Do not use sun lamps, tanning beds, or tanning booths. Talk to your care team if you or your partner wish to become pregnant or think  you might be pregnant. This medication can cause serious birth defects if taken during pregnancy and for 3 months after the last dose. A reliable form of contraception is recommended while taking this medication and for 3 months after the last dose. Talk to your care team about effective forms of contraception. Do not father a child while taking this medication and for 3 months after the last dose. Use a condom while having sex during this time period. Do not breastfeed while taking this medication. This medication may cause infertility. Talk to your care team if you are concerned about your fertility. What side effects may I notice from receiving this medication? Side effects that you should report to your care team as soon as possible: Allergic reactions--skin rash, itching, hives, swelling of the face, lips, tongue, or throat Heart attack--pain or tightness in the chest, shoulders, arms,  or jaw, nausea, shortness of breath, cold or clammy skin, feeling faint or lightheaded Heart failure--shortness of breath, swelling of the ankles, feet, or hands, sudden weight gain, unusual weakness or fatigue Heart rhythm changes--fast or irregular heartbeat, dizziness, feeling faint or lightheaded, chest pain, trouble breathing High ammonia level--unusual weakness or fatigue, confusion, loss of appetite, nausea, vomiting, seizures Infection--fever, chills, cough, sore throat, wounds that don't heal, pain or trouble when passing urine, general feeling of discomfort or being unwell Low red blood cell level--unusual weakness or fatigue, dizziness, headache, trouble breathing Pain, tingling, or numbness in the hands or feet, muscle weakness, change in vision, confusion or trouble speaking, loss of balance or coordination, trouble walking, seizures Redness, swelling, and blistering of the skin over hands and feet Severe or prolonged diarrhea Unusual bruising or bleeding Side effects that usually do not require medical attention (report to your care team if they continue or are bothersome): Dry skin Headache Increased tears Nausea Pain, redness, or swelling with sores inside the mouth or throat Sensitivity to light Vomiting This list may not describe all possible side effects. Call your doctor for medical advice about side effects. You may report side effects to FDA at 1-800-FDA-1088. Where should I keep my medication? This medication is given in a hospital or clinic. It will not be stored at home. NOTE: This sheet is a summary. It may not cover all possible information. If you have questions about this medicine, talk to your doctor, pharmacist, or health care provider.  2024 Elsevier/Gold Standard (2021-11-02 00:00:00)        To help prevent nausea and vomiting after your treatment, we encourage you to take your nausea medication as directed.  BELOW ARE SYMPTOMS THAT SHOULD BE REPORTED  IMMEDIATELY: *FEVER GREATER THAN 100.4 F (38 C) OR HIGHER *CHILLS OR SWEATING *NAUSEA AND VOMITING THAT IS NOT CONTROLLED WITH YOUR NAUSEA MEDICATION *UNUSUAL SHORTNESS OF BREATH *UNUSUAL BRUISING OR BLEEDING *URINARY PROBLEMS (pain or burning when urinating, or frequent urination) *BOWEL PROBLEMS (unusual diarrhea, constipation, pain near the anus) TENDERNESS IN MOUTH AND THROAT WITH OR WITHOUT PRESENCE OF ULCERS (sore throat, sores in mouth, or a toothache) UNUSUAL RASH, SWELLING OR PAIN  UNUSUAL VAGINAL DISCHARGE OR ITCHING   Items with * indicate a potential emergency and should be followed up as soon as possible or go to the Emergency Department if any problems should occur.  Please show the CHEMOTHERAPY ALERT CARD or IMMUNOTHERAPY ALERT CARD at check-in to the Emergency Department and triage nurse.  Should you have questions after your visit or need to cancel or reschedule your appointment, please contact [facility]  [place] [phone]  and follow the prompts.  Office hours are 8:00 a.m. to 4:30 p.m. Monday - Friday. Please note that voicemails left after 4:00 p.m. may not be returned until the following business day.  We are closed weekends and major holidays. You have access to a nurse at all times for urgent questions. Please call the main number to the clinic [phone] and follow the prompts.  For any non-urgent questions, you may also contact your provider using MyChart. We now offer e-Visits for anyone 7 and older to request care online for non-urgent symptoms. For details visit [email or web address].   Also download the MyChart app! Go to the app store, search "MyChart", open the app, select [facility], and log in with your MyChart username and password.

## 2023-01-25 NOTE — Progress Notes (Signed)
Patient presents today for chemotherapy infusion. Patient is in satisfactory condition with no new complaints voiced.  Vital signs are stable.  Labs reviewed by Dr. Ellin Saba during the office visit and all labs are within treatment parameters. ANC on 01/24/23 was 1.1.  Patient was given GCSF on 01/24/23.  ANC today is 7.3.   We will proceed with treatment per MD orders.   Patient tolerated treatment well with no complaints voiced.  Patient left ambulatory in stable condition.  Vital signs stable at discharge.  Follow up as scheduled.

## 2023-01-27 ENCOUNTER — Inpatient Hospital Stay: Payer: 59

## 2023-01-27 VITALS — BP 116/74 | HR 87 | Temp 97.1°F | Resp 18

## 2023-01-27 DIAGNOSIS — Z5189 Encounter for other specified aftercare: Secondary | ICD-10-CM | POA: Diagnosis not present

## 2023-01-27 DIAGNOSIS — R739 Hyperglycemia, unspecified: Secondary | ICD-10-CM | POA: Diagnosis not present

## 2023-01-27 DIAGNOSIS — Z452 Encounter for adjustment and management of vascular access device: Secondary | ICD-10-CM | POA: Diagnosis not present

## 2023-01-27 DIAGNOSIS — R7401 Elevation of levels of liver transaminase levels: Secondary | ICD-10-CM | POA: Diagnosis not present

## 2023-01-27 DIAGNOSIS — F32A Depression, unspecified: Secondary | ICD-10-CM | POA: Diagnosis not present

## 2023-01-27 DIAGNOSIS — Z5111 Encounter for antineoplastic chemotherapy: Secondary | ICD-10-CM | POA: Diagnosis not present

## 2023-01-27 DIAGNOSIS — C20 Malignant neoplasm of rectum: Secondary | ICD-10-CM | POA: Diagnosis not present

## 2023-01-27 MED ORDER — SODIUM CHLORIDE 0.9% FLUSH
10.0000 mL | INTRAVENOUS | Status: DC | PRN
  Administered 2023-01-27: 10 mL

## 2023-01-27 MED ORDER — HEPARIN SOD (PORK) LOCK FLUSH 100 UNIT/ML IV SOLN
500.0000 [IU] | Freq: Once | INTRAVENOUS | Status: AC | PRN
  Administered 2023-01-27: 500 [IU]

## 2023-01-27 MED ORDER — PEGFILGRASTIM-JMDB 6 MG/0.6ML ~~LOC~~ SOSY
6.0000 mg | PREFILLED_SYRINGE | Freq: Once | SUBCUTANEOUS | Status: AC
  Administered 2023-01-27: 6 mg via SUBCUTANEOUS
  Filled 2023-01-27: qty 0.6

## 2023-01-27 NOTE — Patient Instructions (Signed)
[facility]  Discharge Instructions: Thank you for choosing [facility] to provide your oncology and hematology care.  If you have a lab appointment with the Cancer Center - please note that after April 8th, 2024, all labs will be drawn in the cancer center.  You do not have to check in or register with the main entrance as you have in the past but will complete your check-in in the cancer center.  Wear comfortable clothing and clothing appropriate for easy access to any Portacath or PICC line.   We strive to give you quality time with your provider. You may need to reschedule your appointment if you arrive late (15 or more minutes).  Arriving late affects you and other patients whose appointments are after yours.  Also, if you miss three or more appointments without notifying the office, you may be dismissed from the clinic at the provider's discretion.      For prescription refill requests, have your pharmacy contact our office and allow 72 hours for refills to be completed.    Today you received the following chemotherapy and/or immunotherapy agents Fulphila.  Pegfilgrastim Injection What is this medication? PEGFILGRASTIM (PEG fil gra stim) lowers the risk of infection in people who are receiving chemotherapy. It works by Systems analyst make more white blood cells, which protects your body from infection. It may also be used to help people who have been exposed to high doses of radiation. This medicine may be used for other purposes; ask your health care provider or pharmacist if you have questions. COMMON BRAND NAME(S): Cherly Hensen, Neulasta, Nyvepria, Stimufend, UDENYCA, UDENYCA ONBODY, Ziextenzo What should I tell my care team before I take this medication? They need to know if you have any of these conditions: Kidney disease Latex allergy Ongoing radiation therapy Sickle cell disease Skin reactions to acrylic adhesives (On-Body Injector  only) An unusual or allergic reaction to pegfilgrastim, filgrastim, other medications, foods, dyes, or preservatives Pregnant or trying to get pregnant Breast-feeding How should I use this medication? This medication is for injection under the skin. If you get this medication at home, you will be taught how to prepare and give the pre-filled syringe or how to use the On-body Injector. Refer to the patient Instructions for Use for detailed instructions. Use exactly as directed. Tell your care team immediately if you suspect that the On-body Injector may not have performed as intended or if you suspect the use of the On-body Injector resulted in a missed or partial dose. It is important that you put your used needles and syringes in a special sharps container. Do not put them in a trash can. If you do not have a sharps container, call your pharmacist or care team to get one. Talk to your care team about the use of this medication in children. While this medication may be prescribed for selected conditions, precautions do apply. Overdosage: If you think you have taken too much of this medicine contact a poison control center or emergency room at once. NOTE: This medicine is only for you. Do not share this medicine with others. What if I miss a dose? It is important not to miss your dose. Call your care team if you miss your dose. If you miss a dose due to an On-body Injector failure or leakage, a new dose should be administered as soon as possible using a single prefilled syringe for manual use. What may interact with this medication? Interactions  have not been studied. This list may not describe all possible interactions. Give your health care provider a list of all the medicines, herbs, non-prescription drugs, or dietary supplements you use. Also tell them if you smoke, drink alcohol, or use illegal drugs. Some items may interact with your medicine. What should I watch for while using this  medication? Your condition will be monitored carefully while you are receiving this medication. You may need blood work done while you are taking this medication. Talk to your care team about your risk of cancer. You may be more at risk for certain types of cancer if you take this medication. If you are going to need a MRI, CT scan, or other procedure, tell your care team that you are using this medication (On-Body Injector only). What side effects may I notice from receiving this medication? Side effects that you should report to your care team as soon as possible: Allergic reactions--skin rash, itching, hives, swelling of the face, lips, tongue, or throat Capillary leak syndrome--stomach or muscle pain, unusual weakness or fatigue, feeling faint or lightheaded, decrease in the amount of urine, swelling of the ankles, hands, or feet, trouble breathing High white blood cell level--fever, fatigue, trouble breathing, night sweats, change in vision, weight loss Inflammation of the aorta--fever, fatigue, back, chest, or stomach pain, severe headache Kidney injury (glomerulonephritis)--decrease in the amount of urine, red or dark brown urine, foamy or bubbly urine, swelling of the ankles, hands, or feet Shortness of breath or trouble breathing Spleen injury--pain in upper left stomach or shoulder Unusual bruising or bleeding Side effects that usually do not require medical attention (report to your care team if they continue or are bothersome): Bone pain Pain in the hands or feet This list may not describe all possible side effects. Call your doctor for medical advice about side effects. You may report side effects to FDA at 1-800-FDA-1088. Where should I keep my medication? Keep out of the reach of children. If you are using this medication at home, you will be instructed on how to store it. Throw away any unused medication after the expiration date on the label. NOTE: This sheet is a summary. It  may not cover all possible information. If you have questions about this medicine, talk to your doctor, pharmacist, or health care provider.  2024 Elsevier/Gold Standard (2021-05-28 00:00:00)        To help prevent nausea and vomiting after your treatment, we encourage you to take your nausea medication as directed.  BELOW ARE SYMPTOMS THAT SHOULD BE REPORTED IMMEDIATELY: *FEVER GREATER THAN 100.4 F (38 C) OR HIGHER *CHILLS OR SWEATING *NAUSEA AND VOMITING THAT IS NOT CONTROLLED WITH YOUR NAUSEA MEDICATION *UNUSUAL SHORTNESS OF BREATH *UNUSUAL BRUISING OR BLEEDING *URINARY PROBLEMS (pain or burning when urinating, or frequent urination) *BOWEL PROBLEMS (unusual diarrhea, constipation, pain near the anus) TENDERNESS IN MOUTH AND THROAT WITH OR WITHOUT PRESENCE OF ULCERS (sore throat, sores in mouth, or a toothache) UNUSUAL RASH, SWELLING OR PAIN  UNUSUAL VAGINAL DISCHARGE OR ITCHING   Items with * indicate a potential emergency and should be followed up as soon as possible or go to the Emergency Department if any problems should occur.  Please show the CHEMOTHERAPY ALERT CARD or IMMUNOTHERAPY ALERT CARD at check-in to the Emergency Department and triage nurse.  Should you have questions after your visit or need to cancel or reschedule your appointment, please contact [facility] [phone]  and follow the prompts.  Office hours  are 8:00 a.m. to 4:30 p.m. Monday - Friday. Please note that voicemails left after 4:00 p.m. may not be returned until the following business day.  We are closed weekends and major holidays. You have access to a nurse at all times for urgent questions. Please call the main number to the clinic [phone] and follow the prompts.  For any non-urgent questions, you may also contact your provider using MyChart. We now offer e-Visits for anyone 51 and older to request care online for non-urgent symptoms. For details visit  [email or web address].   Also download the MyChart app! Go to the app store, search "MyChart", open the app, select [facility], and log in with your MyChart username and password.

## 2023-01-27 NOTE — Progress Notes (Signed)
Patient here to have 5FU pump disconnected and Fulphila injection. Pump disconnected with no issues. Patients port flushed without difficulty. Good blood return noted with no bruising or swelling noted at site.  Band aid applied. Patient tolerated injection with no complaints voiced.  Site clean and dry with no bruising or swelling noted.  No complaints of pain.  Discharged with vital signs stable and no signs or symptoms of distress noted.

## 2023-02-06 NOTE — Progress Notes (Signed)
[facility] [address] [place] [address]    Clinic Day:  02/07/2023   Referring physician: Marlise Eves, MD  Patient Care Team: Patient, No Pcp Per as PCP - General (General Practice) Doreatha Massed, MD as Medical Oncologist (Medical Oncology) Therese Sarah, RN as Oncology Nurse Navigator (Medical Oncology)   ASSESSMENT & PLAN:   Assessment: 1.  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 - Colonoscopy and biopsy (10/13/2022): Adenocarcinoma.  MMR preserved. - Evaluated at [facility] cancer care. - Pelvic MRI (10/21/2022): A semi-annular ulcerated T3 N1 low rectal tumor with mucinous features sitting at or just above the level of the sphincter complex without demonstrable infiltration of the sphincter complex.  Lymph node measures 5 mm in short axis with minimum distance 5 mm from MRF at 9:00.  Several additional small rounded lymph nodes seen in the vicinity which fall below the threshold.  No demonstrable extra mesorectal pelvic metastatic disease. - Cycle 1 of FOLFOX started on 11/29/2022   2.  Social/family history: - Zachary Nelson is seen with her stepmother ([name]) today.  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  Quit smoking 6 months ago.  Smoked half to 1 pack/day starting at age 14. - Sister had stomach cancer.  Another sister had colon polyps removed.  Maternal aunt had lung cancer.  Maternal great grandfather had stomach cancer.  Paternal grandfather had bone cancer.  Paternal aunt had skin cancer.  Another paternal aunt had breast cancer.  Paternal uncle had lung cancer.    Plan: 1.  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 - Zachary Nelson reported numbness in the fingertips on touching cold objects.  Zachary Nelson also reported skin peeling of on the fingers. - Reviewed labs from yesterday.  AST and ALT elevated at 52 and 67.  White  count was low at 3.1 with ANC of 1.1. - She received G-CSF yesterday.  White count improved to 10 today.  However platelets are 100. - Zachary Nelson will proceed with cycle 5 today.  I will dose reduce by 20%.  We will add long-acting G-CSF on day 3.  Zachary Nelson was instructed to take Claritin to decrease his bone pains. - Will see him back in 2 weeks for follow-up.   2.  Rectal pain: - Pain has now improved.  Zachary Nelson has it occasionally.  Continue tramadol as needed.    No orders of the defined types were placed in this encounter.     Alben Deeds Teague,acting as a Neurosurgeon for Doreatha Massed, MD.,have documented all relevant documentation on the behalf of Doreatha Massed, MD,as directed by  Doreatha Massed, MD while in the presence of Doreatha Massed, MD.  ***    [place] R Teague   7/29/20249:17 AM  CHIEF COMPLAINT:   Diagnosis: rectal adenocarcinoma    Cancer Staging  Rectal adenocarcinoma [facility]) Staging form: Colon and Rectum, AJCC 8th Edition - Clinical stage from 11/18/2022: Stage IIIB (cT3, cN1b, cM0) - Unsigned    Prior Therapy: none  Current Therapy:  neoadjuvant FOLFOX    HISTORY OF PRESENT ILLNESS:   Oncology History  Rectal adenocarcinoma (HCC)  11/18/2022 Initial Diagnosis   Rectal adenocarcinoma (HCC)   11/29/2022 -  Chemotherapy   Patient is on Treatment Plan : COLORECTAL FOLFOX q14d x 4 months     01/04/2023 Genetic Testing   Negative genetic  testing on the CancerNext-Expanded+RNAinsight.  CDH1 p.V495A (c.1484T>C) VUS was found.  The report date is January 04, 2023.  The CancerNext-Expanded gene panel offered by [facility] and includes sequencing and rearrangement analysis for the following 77 genes: AIP, ALK, APC*, ATM*, AXIN2, BAP1, BARD1, BMPR1A, BRCA1*, BRCA2*, BRIP1*, CDC73, CDH1*, CDK4, CDKN1B, CDKN2A, CHEK2*, CTNNA1, DICER1, FH, FLCN, KIF1B, LZTR1, MAX, MEN1, MET, MLH1*, MSH2*, MSH3, MSH6*, MUTYH*, NF1*, NF2, NTHL1, PALB2*, PHOX2B, PMS2*, POT1, PRKAR1A,  PTCH1, PTEN*, RAD51C*, RAD51D*, RB1, RET, SDHA, SDHAF2, SDHB, SDHC, SDHD, SMAD4, SMARCA4, SMARCB1, SMARCE1, STK11, SUFU, TMEM127, TP53*, TSC1, TSC2, and VHL (sequencing and deletion/duplication); EGFR, EGLN1, HOXB13, KIT, MITF, PDGFRA, POLD1, and POLE (sequencing only); EPCAM and GREM1 (deletion/duplication only). DNA and RNA analyses performed for * genes.       INTERVAL HISTORY:   Zachary Nelson is a 47 y.o. male presenting to clinic today for follow up of rectal adenocarcinoma. Zachary Nelson was last seen by me on 01/25/23.  Today, Zachary Nelson states that Zachary Nelson is doing well overall. His appetite level is at ***%. His energy level is at ***%.  PAST MEDICAL HISTORY:   Past Medical History: Past Medical History:  Diagnosis Date   Cancer (HCC)    Family history of breast cancer    Family history of stomach cancer     Surgical History: Past Surgical History:  Procedure Laterality Date   ANKLE FRACTURE SURGERY     FOOT SURGERY     IR IMAGING GUIDED PORT INSERTION  11/25/2022    Social History: Social History   Socioeconomic History   Marital status: Divorced    Spouse name: Not on file   Number of children: Not on file   Years of education: Not on file   Highest education level: Not on file  Occupational History   Not on file  Tobacco Use   Smoking status: Some Days    Types: Cigars    Last attempt to quit: 06/10/2022    Years since quitting: 0.6   Smokeless tobacco: Never  Vaping Use   Vaping status: Some Days  Substance and Sexual Activity   Alcohol use: Yes   Drug use: Not Currently   Sexual activity: Not on file  Other Topics Concern   Not on file  Social History Narrative   ** Merged History Encounter **       Social Determinants of Health   Financial Resource Strain: High Risk (11/28/2022)   Overall Financial Resource Strain (CARDIA)    Difficulty of Paying Living Expenses: Very hard  Food Insecurity: No Food Insecurity (11/22/2022)   Hunger Vital Sign    Worried About Running Out of  Food in the Last Year: Never true    Ran Out of Food in the Last Year: Never true  Transportation Needs: No Transportation Needs (11/22/2022)   PRAPARE - Administrator, Civil Service (Medical): No    Lack of Transportation (Non-Medical): No  Physical Activity: Not on file  Stress: Not on file  Social Connections: Not on file  Intimate Partner Violence: Not At Risk (11/22/2022)   Humiliation, Afraid, Rape, and Kick questionnaire    Fear of Current or Ex-Partner: No    Emotionally Abused: No    Physically Abused: No    Sexually Abused: No    Family History: Family History  Problem Relation Age of Onset   Stomach cancer Sister 68 - 20   Other Sister        ovary surgery   Cancer Maternal Aunt  NOS   Breast cancer Paternal Aunt        dx.>50   Melanoma Paternal Aunt 75 - 59   Aneurysm Paternal Grandmother    Bone cancer Paternal Grandfather        dx.>50   Cancer Cousin 16       pat first cousin with NOS cancer    Current Medications:  Current Outpatient Medications:    dextrose 5 % SOLN 1,000 mL with fluorouracil 5 GM/100ML SOLN, Inject into the vein over 48 hr. Every 14 days, Disp: , Rfl:    FLUOROURACIL IV, Inject into the vein every 14 (fourteen) days., Disp: , Rfl:    HYDROcodone-acetaminophen (NORCO/VICODIN) 5-325 MG tablet, Take 2 tablets by mouth every 6 (six) hours as needed for severe pain., Disp: 12 tablet, Rfl: 0   LEUCOVORIN CALCIUM IV, Inject into the vein every 14 (fourteen) days., Disp: , Rfl:    lidocaine (XYLOCAINE) 2 % solution, Use as directed 15 mLs in the mouth or throat as needed for mouth pain., Disp: 10 mL, Rfl: 1   Lidocaine 3 % CREA, Apply 1 Application topically 3 (three) times daily as needed., Disp: 28.35 g, Rfl: 0   lidocaine-prilocaine (EMLA) cream, Apply a quarter-sized amount to port a cath site and cover with plastic wrap 1 hour prior to infusion appointments, Disp: 30 g, Rfl: 3   OXALIPLATIN IV, Inject into the vein every 14  (fourteen) days., Disp: , Rfl:    prochlorperazine (COMPAZINE) 10 MG tablet, Take 1 tablet (10 mg total) by mouth every 6 (six) hours as needed for nausea or vomiting., Disp: 60 tablet, Rfl: 4   Allergies: Allergies  Allergen Reactions   Bee Venom     REVIEW OF SYSTEMS:   Review of Systems  Constitutional:  Negative for chills, fatigue and fever.  HENT:   Negative for lump/mass, mouth sores, nosebleeds, sore throat and trouble swallowing.   Eyes:  Negative for eye problems.  Respiratory:  Negative for cough and shortness of breath.   Cardiovascular:  Negative for chest pain, leg swelling and palpitations.  Gastrointestinal:  Negative for abdominal pain, constipation, diarrhea, nausea and vomiting.  Genitourinary:  Negative for bladder incontinence, difficulty urinating, dysuria, frequency, hematuria and nocturia.   Musculoskeletal:  Negative for arthralgias, back pain, flank pain, myalgias and neck pain.  Skin:  Negative for itching and rash.  Neurological:  Negative for dizziness, headaches and numbness.  Hematological:  Does not bruise/bleed easily.  Psychiatric/Behavioral:  Negative for depression, sleep disturbance and suicidal ideas. The patient is not nervous/anxious.   All other systems reviewed and are negative.    VITALS:   There were no vitals taken for this visit.  Wt Readings from Last 3 Encounters:  01/25/23 203 lb 3.2 oz (92.2 kg)  01/11/23 202 lb 6.4 oz (91.8 kg)  01/01/23 202 lb 9.6 oz (91.9 kg)    There is no height or weight on file to calculate BMI.  Performance status (ECOG): 1 - Symptomatic but completely ambulatory  PHYSICAL EXAM:   Physical Exam Vitals and nursing note reviewed. Exam conducted with a chaperone present.  Constitutional:      Appearance: Normal appearance.  Cardiovascular:     Rate and Rhythm: Normal rate and regular rhythm.     Pulses: Normal pulses.     Heart sounds: Normal heart sounds.  Pulmonary:     Effort: Pulmonary effort  is normal.     Breath sounds: Normal breath sounds.  Abdominal:  Palpations: Abdomen is soft. There is no hepatomegaly, splenomegaly or mass.     Tenderness: There is no abdominal tenderness.  Musculoskeletal:     Right lower leg: No edema.     Left lower leg: No edema.  Lymphadenopathy:     Cervical: No cervical adenopathy.     Right cervical: No superficial, deep or posterior cervical adenopathy.    Left cervical: No superficial, deep or posterior cervical adenopathy.     Upper Body:     Right upper body: No supraclavicular or axillary adenopathy.     Left upper body: No supraclavicular or axillary adenopathy.  Neurological:     General: No focal deficit present.     Mental Status: Zachary Nelson is alert and oriented to person, place, and time.  Psychiatric:        Mood and Affect: Mood normal.        Behavior: Behavior normal.     LABS:      Latest Ref Rng & Units 01/25/2023   10:49 AM 01/24/2023    1:18 PM 01/11/2023    9:26 AM  CBC  WBC 4.0 - 10.5 K/uL 10.0  3.1  3.2   Hemoglobin 13.0 - 17.0 g/dL 69.6  29.5  28.4   Hematocrit 39.0 - 52.0 % 35.4  35.2  37.0   Platelets 150 - 400 K/uL 100  120  146       Latest Ref Rng & Units 01/24/2023    1:18 PM 01/11/2023    9:26 AM 12/26/2022    9:14 AM  CMP  Glucose 70 - 99 mg/dL 132  440  102   BUN 6 - 20 mg/dL 14  13  13    Creatinine 0.61 - 1.24 mg/dL 7.25  3.66  4.40   Sodium 135 - 145 mmol/L 138  136  137   Potassium 3.5 - 5.1 mmol/L 4.0  3.8  3.5   Chloride 98 - 111 mmol/L 107  106  106   CO2 22 - 32 mmol/L 25  24  25    Calcium 8.9 - 10.3 mg/dL 9.1  8.7  8.5   Total Protein 6.5 - 8.1 g/dL 7.4  7.2  6.9   Total Bilirubin 0.3 - 1.2 mg/dL 0.5  0.8  0.6   Alkaline Phos 38 - 126 U/L 71  61  57   AST 15 - 41 U/L 52  24  25   ALT 0 - 44 U/L 67  21  20      Lab Results  Component Value Date   CEA1 3.1 11/29/2022   /  CEA  Date Value Ref Range Status  11/29/2022 3.1 0.0 - 4.7 ng/mL Final    Comment:    (NOTE)                              Nonsmokers          <3.9                             Smokers             <5.6 Roche [employer]ostics Electrochemiluminescence Immunoassay (ECLIA) Values obtained with different assay methods or kits cannot be used interchangeably.  Results cannot be interpreted as absolute evidence of the presence or absence of malignant disease. Performed At: [facility] [address] [place], [place] [id number] Jolene Schimke MD [id number]  No results found for: "PSA1" No results found for: "XBM841" No results found for: "CAN125"  No results found for: "TOTALPROTELP", "ALBUMINELP", "A1GS", "A2GS", "BETS", "BETA2SER", "GAMS", "MSPIKE", "SPEI" No results found for: "TIBC", "FERRITIN", "IRONPCTSAT" No results found for: "LDH"   STUDIES:   No results found.

## 2023-02-07 ENCOUNTER — Inpatient Hospital Stay: Payer: 59

## 2023-02-07 ENCOUNTER — Inpatient Hospital Stay (HOSPITAL_BASED_OUTPATIENT_CLINIC_OR_DEPARTMENT_OTHER): Payer: 59 | Admitting: Hematology

## 2023-02-07 VITALS — BP 140/95 | HR 59 | Temp 96.6°F | Resp 18

## 2023-02-07 DIAGNOSIS — C20 Malignant neoplasm of rectum: Secondary | ICD-10-CM

## 2023-02-07 DIAGNOSIS — R7401 Elevation of levels of liver transaminase levels: Secondary | ICD-10-CM | POA: Diagnosis not present

## 2023-02-07 DIAGNOSIS — Z5189 Encounter for other specified aftercare: Secondary | ICD-10-CM | POA: Diagnosis not present

## 2023-02-07 DIAGNOSIS — F32A Depression, unspecified: Secondary | ICD-10-CM | POA: Diagnosis not present

## 2023-02-07 DIAGNOSIS — Z5111 Encounter for antineoplastic chemotherapy: Secondary | ICD-10-CM | POA: Diagnosis not present

## 2023-02-07 DIAGNOSIS — Z452 Encounter for adjustment and management of vascular access device: Secondary | ICD-10-CM | POA: Diagnosis not present

## 2023-02-07 DIAGNOSIS — R739 Hyperglycemia, unspecified: Secondary | ICD-10-CM | POA: Diagnosis not present

## 2023-02-07 LAB — CBC WITH DIFFERENTIAL/PLATELET
Abs Immature Granulocytes: 0.2 10*3/uL — ABNORMAL HIGH (ref 0.00–0.07)
Band Neutrophils: 1 %
Basophils Absolute: 0 10*3/uL (ref 0.0–0.1)
Basophils Relative: 0 %
Eosinophils Absolute: 0.2 10*3/uL (ref 0.0–0.5)
Eosinophils Relative: 2 %
HCT: 34.9 % — ABNORMAL LOW (ref 39.0–52.0)
Hemoglobin: 11.5 g/dL — ABNORMAL LOW (ref 13.0–17.0)
Lymphocytes Relative: 29 %
Lymphs Abs: 2.3 10*3/uL (ref 0.7–4.0)
MCH: 29.7 pg (ref 26.0–34.0)
MCHC: 33 g/dL (ref 30.0–36.0)
MCV: 90.2 fL (ref 80.0–100.0)
Metamyelocytes Relative: 2 %
Monocytes Absolute: 0.8 10*3/uL (ref 0.1–1.0)
Monocytes Relative: 10 %
Myelocytes: 1 %
Neutro Abs: 4.5 10*3/uL (ref 1.7–7.7)
Neutrophils Relative %: 55 %
Platelets: 100 10*3/uL — ABNORMAL LOW (ref 150–400)
RBC: 3.87 MIL/uL — ABNORMAL LOW (ref 4.22–5.81)
RDW: 16.9 % — ABNORMAL HIGH (ref 11.5–15.5)
WBC: 8 10*3/uL (ref 4.0–10.5)
nRBC: 0.9 % — ABNORMAL HIGH (ref 0.0–0.2)
nRBC: 2 /100 WBC — ABNORMAL HIGH

## 2023-02-07 LAB — COMPREHENSIVE METABOLIC PANEL
ALT: 39 U/L (ref 0–44)
AST: 40 U/L (ref 15–41)
Albumin: 3.8 g/dL (ref 3.5–5.0)
Alkaline Phosphatase: 88 U/L (ref 38–126)
Anion gap: 2 — ABNORMAL LOW (ref 5–15)
BUN: 14 mg/dL (ref 6–20)
CO2: 25 mmol/L (ref 22–32)
Calcium: 8.6 mg/dL — ABNORMAL LOW (ref 8.9–10.3)
Chloride: 106 mmol/L (ref 98–111)
Creatinine, Ser: 1.29 mg/dL — ABNORMAL HIGH (ref 0.61–1.24)
GFR, Estimated: >60 mL/min (ref >60)
Glucose, Bld: 93 mg/dL (ref 70–99)
Potassium: 3.8 mmol/L (ref 3.5–5.1)
Sodium: 133 mmol/L — ABNORMAL LOW (ref 135–145)
Total Bilirubin: 0.8 mg/dL (ref 0.3–1.2)
Total Protein: 7.4 g/dL (ref 6.5–8.1)

## 2023-02-07 LAB — MAGNESIUM: Magnesium: 2.2 mg/dL (ref 1.7–2.4)

## 2023-02-07 MED ORDER — OXALIPLATIN CHEMO INJECTION 100 MG/20ML
68.0000 mg/m2 | Freq: Once | INTRAVENOUS | Status: AC
  Administered 2023-02-07: 150 mg via INTRAVENOUS
  Filled 2023-02-07: qty 20

## 2023-02-07 MED ORDER — PALONOSETRON HCL INJECTION 0.25 MG/5ML
0.2500 mg | Freq: Once | INTRAVENOUS | Status: AC
  Administered 2023-02-07: 0.25 mg via INTRAVENOUS
  Filled 2023-02-07: qty 5

## 2023-02-07 MED ORDER — SODIUM CHLORIDE 0.9% FLUSH: 10.0000 mL | INTRAVENOUS | Status: DC | PRN

## 2023-02-07 MED ORDER — DEXTROSE 5 % IV SOLN: Freq: Once | INTRAVENOUS | Status: AC

## 2023-02-07 MED ORDER — SODIUM CHLORIDE 0.9 % IV SOLN
1920.0000 mg/m2 | INTRAVENOUS | Status: DC
  Administered 2023-02-07: 4200 mg via INTRAVENOUS
  Filled 2023-02-07: qty 84

## 2023-02-07 MED ORDER — LEUCOVORIN CALCIUM INJECTION 350 MG
320.0000 mg/m2 | Freq: Once | INTRAVENOUS | Status: AC
  Administered 2023-02-07: 698 mg via INTRAVENOUS
  Filled 2023-02-07: qty 34.9

## 2023-02-07 MED ORDER — HEPARIN SOD (PORK) LOCK FLUSH 100 UNIT/ML IV SOLN: 500.0000 [IU] | Freq: Once | INTRAVENOUS | Status: DC | PRN

## 2023-02-07 MED ORDER — FLUOROURACIL CHEMO INJECTION 2.5 GM/50ML
320.0000 mg/m2 | Freq: Once | INTRAVENOUS | Status: AC
  Administered 2023-02-07: 700 mg via INTRAVENOUS
  Filled 2023-02-07: qty 14

## 2023-02-07 MED ORDER — ESCITALOPRAM OXALATE 10 MG PO TABS: 10.0000 mg | ORAL_TABLET | Freq: Every day | ORAL | 2 refills | Status: AC

## 2023-02-07 MED ORDER — SODIUM CHLORIDE 0.9 % IV SOLN
10.0000 mg | Freq: Once | INTRAVENOUS | Status: AC
  Administered 2023-02-07: 10 mg via INTRAVENOUS
  Filled 2023-02-07: qty 10

## 2023-02-07 MED ORDER — SODIUM CHLORIDE 0.9% FLUSH
10.0000 mL | Freq: Once | INTRAVENOUS | Status: AC
  Administered 2023-02-07: 10 mL via INTRAVENOUS

## 2023-02-07 NOTE — Progress Notes (Signed)
Patient has been examined by Dr. Ellin Saba. Vital signs and labs have been reviewed by MD - ANC, Creatinine, LFTs, hemoglobin, and platelets are within treatment parameters per M.D. - pt may proceed with treatment. Chemotherapy dose-reduced by 20%. Primary RN and pharmacy notified.

## 2023-02-07 NOTE — Patient Instructions (Addendum)
[facility] Discharge Instructions   You were seen and examined today by Dr. Ellin Saba.  He reviewed the results of your lab work which are normal/stable.   We will proceed with your treatment today.   We sent Lexapro to your pharmacy. This is an anti-depressant medication to help with anxiety and depression. Take daily as prescribed.   Return as scheduled.    Thank you for choosing [facility] to provide your oncology and hematology care.  To afford each patient quality time with our provider, please arrive at least 15 minutes before your scheduled appointment time.   If you have a lab appointment with the Cancer Center please come in thru the Main Entrance and check in at the main information desk.  You need to re-schedule your appointment should you arrive 10 or more minutes late.  We strive to give you quality time with our providers, and arriving late affects you and other patients whose appointments are after yours.  Also, if you no show three or more times for appointments you may be dismissed from the clinic at the providers discretion.     Again, thank you for choosing [facility].  Our hope is that these requests will decrease the amount of time that you wait before being seen by our physicians.       _____________________________________________________________  Should you have questions after your visit to [facility], please contact our office at [phone] and follow the prompts.  Our office hours are 8:00 a.m. and 4:30 p.m. Monday - Friday.  Please note that voicemails left after 4:00 p.m. may not be returned until the following business day.  We are closed weekends and major holidays.  You do have access to a nurse 24-7, just call the main number to the clinic [phone] and do not press any options, hold on the line and a nurse will answer the phone.    For prescription  refill requests, have your pharmacy contact our office and allow 72 hours.    Due to Covid, you will need to wear a mask upon entering the hospital. If you do not have a mask, a mask will be given to you at the Main Entrance upon arrival. For doctor visits, patients may have 1 support person age 38 or older with them. For treatment visits, patients can not have anyone with them due to social distancing guidelines and our immunocompromised population.

## 2023-02-07 NOTE — Patient Instructions (Signed)
[facility]  Discharge Instructions: Thank you for choosing [facility] to provide your oncology and hematology care.  If you have a lab appointment with the Cancer Center - please note that after April 8th, 2024, all labs will be drawn in the cancer center.  You do not have to check in or register with the main entrance as you have in the past but will complete your check-in in the cancer center.  Wear comfortable clothing and clothing appropriate for easy access to any Portacath or PICC line.   We strive to give you quality time with your provider. You may need to reschedule your appointment if you arrive late (15 or more minutes).  Arriving late affects you and other patients whose appointments are after yours.  Also, if you miss three or more appointments without notifying the office, you may be dismissed from the clinic at the provider's discretion.      For prescription refill requests, have your pharmacy contact our office and allow 72 hours for refills to be completed.    Today you received the following chemotherapy and/or immunotherapy agents Oxaliplatin/Leucovorin/5FU.  Oxaliplatin Injection What is this medication? OXALIPLATIN (ox AL i PLA tin) treats colorectal cancer. It works by slowing down the growth of cancer cells. This medicine may be used for other purposes; ask your health care provider or pharmacist if you have questions. COMMON BRAND NAME(S): Eloxatin What should I tell my care team before I take this medication? They need to know if you have any of these conditions: Heart disease History of irregular heartbeat or rhythm Liver disease Low blood cell levels (white cells, red cells, and platelets) Lung or breathing disease, such as asthma Take medications that treat or prevent blood clots Tingling of the fingers, toes, or other nerve disorder An unusual or allergic reaction to oxaliplatin, other medications, foods, dyes, or  preservatives If you or your partner are pregnant or trying to get pregnant Breast-feeding How should I use this medication? This medication is injected into a vein. It is given by your care team in a hospital or clinic setting. Talk to your care team about the use of this medication in children. Special care may be needed. Overdosage: If you think you have taken too much of this medicine contact a poison control center or emergency room at once. NOTE: This medicine is only for you. Do not share this medicine with others. What if I miss a dose? Keep appointments for follow-up doses. It is important not to miss a dose. Call your care team if you are unable to keep an appointment. What may interact with this medication? Do not take this medication with any of the following: Cisapride Dronedarone Pimozide Thioridazine This medication may also interact with the following: Aspirin and aspirin-like medications Certain medications that treat or prevent blood clots, such as warfarin, apixaban, dabigatran, and rivaroxaban Cisplatin Cyclosporine Diuretics Medications for infection, such as acyclovir, adefovir, amphotericin B, bacitracin, cidofovir, foscarnet, ganciclovir, gentamicin, pentamidine, vancomycin NSAIDs, medications for pain and inflammation, such as ibuprofen or naproxen Other medications that cause heart rhythm changes Pamidronate Zoledronic acid This list may not describe all possible interactions. Give your health care provider a list of all the medicines, herbs, non-prescription drugs, or dietary supplements you use. Also tell them if you smoke, drink alcohol, or use illegal drugs. Some items may interact with your medicine. What should I watch for while using this medication? Your condition will be monitored carefully while  you are receiving this medication. You may need blood work while taking this medication. This medication may make you feel generally unwell. This is not  uncommon as chemotherapy can affect healthy cells as well as cancer cells. Report any side effects. Continue your course of treatment even though you feel ill unless your care team tells you to stop. This medication may increase your risk of getting an infection. Call your care team for advice if you get a fever, chills, sore throat, or other symptoms of a cold or flu. Do not treat yourself. Try to avoid being around people who are sick. Avoid taking medications that contain aspirin, acetaminophen, ibuprofen, naproxen, or ketoprofen unless instructed by your care team. These medications may hide a fever. Be careful brushing or flossing your teeth or using a toothpick because you may get an infection or bleed more easily. If you have any dental work done, tell your dentist you are receiving this medication. This medication can make you more sensitive to cold. Do not drink cold drinks or use ice. Cover exposed skin before coming in contact with cold temperatures or cold objects. When out in cold weather wear warm clothing and cover your mouth and nose to warm the air that goes into your lungs. Tell your care team if you get sensitive to the cold. Talk to your care team if you or your partner are pregnant or think either of you might be pregnant. This medication can cause serious birth defects if taken during pregnancy and for 9 months after the last dose. A negative pregnancy test is required before starting this medication. A reliable form of contraception is recommended while taking this medication and for 9 months after the last dose. Talk to your care team about effective forms of contraception. Do not father a child while taking this medication and for 6 months after the last dose. Use a condom while having sex during this time period. Do not breastfeed while taking this medication and for 3 months after the last dose. This medication may cause infertility. Talk to your care team if you are concerned about  your fertility. What side effects may I notice from receiving this medication? Side effects that you should report to your care team as soon as possible: Allergic reactions--skin rash, itching, hives, swelling of the face, lips, tongue, or throat Bleeding--bloody or black, tar-like stools, vomiting blood or Kyah Buesing material that looks like coffee grounds, red or dark Felicite Zeimet urine, small red or purple spots on skin, unusual bruising or bleeding Dry cough, shortness of breath or trouble breathing Heart rhythm changes--fast or irregular heartbeat, dizziness, feeling faint or lightheaded, chest pain, trouble breathing Infection--fever, chills, cough, sore throat, wounds that don't heal, pain or trouble when passing urine, general feeling of discomfort or being unwell Liver injury--right upper belly pain, loss of appetite, nausea, light-colored stool, dark yellow or Oren Barella urine, yellowing skin or eyes, unusual weakness or fatigue Low red blood cell level--unusual weakness or fatigue, dizziness, headache, trouble breathing Muscle injury--unusual weakness or fatigue, muscle pain, dark yellow or Cordie Buening urine, decrease in amount of urine Pain, tingling, or numbness in the hands or feet Sudden and severe headache, confusion, change in vision, seizures, which may be signs of posterior reversible encephalopathy syndrome (PRES) Unusual bruising or bleeding Side effects that usually do not require medical attention (report to your care team if they continue or are bothersome): Diarrhea Nausea Pain, redness, or swelling with sores inside the mouth or throat Unusual weakness or  fatigue Vomiting This list may not describe all possible side effects. Call your doctor for medical advice about side effects. You may report side effects to FDA at 1-800-FDA-1088. Where should I keep my medication? This medication is given in a hospital or clinic. It will not be stored at home. NOTE: This sheet is a summary. It may not  cover all possible information. If you have questions about this medicine, talk to your doctor, pharmacist, or health care provider.  2024 Elsevier/Gold Standard (2022-04-12 00:00:00)    Leucovorin Injection What is this medication? LEUCOVORIN (loo koe VOR in) prevents side effects from certain medications, such as methotrexate. It works by increasing folate levels. This helps protect healthy cells in your body. It may also be used to treat anemia caused by low levels of folate. It can also be used with fluorouracil, a type of chemotherapy, to treat colorectal cancer. It works by increasing the effects of fluorouracil in the body. This medicine may be used for other purposes; ask your health care provider or pharmacist if you have questions. What should I tell my care team before I take this medication? They need to know if you have any of these conditions: Anemia from low levels of vitamin B12 in the blood An unusual or allergic reaction to leucovorin, folic acid, other medications, foods, dyes, or preservatives Pregnant or trying to get pregnant Breastfeeding How should I use this medication? This medication is injected into a vein or a muscle. It is given by your care team in a hospital or clinic setting. Talk to your care team about the use of this medication in children. Special care may be needed. Overdosage: If you think you have taken too much of this medicine contact a poison control center or emergency room at once. NOTE: This medicine is only for you. Do not share this medicine with others. What if I miss a dose? Keep appointments for follow-up doses. It is important not to miss your dose. Call your care team if you are unable to keep an appointment. What may interact with this medication? Capecitabine Fluorouracil Phenobarbital Phenytoin Primidone Trimethoprim;sulfamethoxazole This list may not describe all possible interactions. Give your health care provider a list of all  the medicines, herbs, non-prescription drugs, or dietary supplements you use. Also tell them if you smoke, drink alcohol, or use illegal drugs. Some items may interact with your medicine. What should I watch for while using this medication? Your condition will be monitored carefully while you are receiving this medication. This medication may increase the side effects of 5-fluorouracil. Tell your care team if you have diarrhea or mouth sores that do not get better or that get worse. What side effects may I notice from receiving this medication? Side effects that you should report to your care team as soon as possible: Allergic reactions--skin rash, itching, hives, swelling of the face, lips, tongue, or throat This list may not describe all possible side effects. Call your doctor for medical advice about side effects. You may report side effects to FDA at 1-800-FDA-1088. Where should I keep my medication? This medication is given in a hospital or clinic. It will not be stored at home. NOTE: This sheet is a summary. It may not cover all possible information. If you have questions about this medicine, talk to your doctor, pharmacist, or health care provider.  2024 Elsevier/Gold Standard (2021-11-30 00:00:00)    Fluorouracil Injection What is this medication? FLUOROURACIL (flure oh YOOR a sil) treats some types  of cancer. It works by slowing down the growth of cancer cells. This medicine may be used for other purposes; ask your health care provider or pharmacist if you have questions. COMMON BRAND NAME(S): Adrucil What should I tell my care team before I take this medication? They need to know if you have any of these conditions: Blood disorders Dihydropyrimidine dehydrogenase (DPD) deficiency Infection, such as chickenpox, cold sores, herpes Kidney disease Liver disease Poor nutrition Recent or ongoing radiation therapy An unusual or allergic reaction to fluorouracil, other medications,  foods, dyes, or preservatives If you or your partner are pregnant or trying to get pregnant Breast-feeding How should I use this medication? This medication is injected into a vein. It is administered by your care team in a hospital or clinic setting. Talk to your care team about the use of this medication in children. Special care may be needed. Overdosage: If you think you have taken too much of this medicine contact a poison control center or emergency room at once. NOTE: This medicine is only for you. Do not share this medicine with others. What if I miss a dose? Keep appointments for follow-up doses. It is important not to miss your dose. Call your care team if you are unable to keep an appointment. What may interact with this medication? Do not take this medication with any of the following: Live virus vaccines This medication may also interact with the following: Medications that treat or prevent blood clots, such as warfarin, enoxaparin, dalteparin This list may not describe all possible interactions. Give your health care provider a list of all the medicines, herbs, non-prescription drugs, or dietary supplements you use. Also tell them if you smoke, drink alcohol, or use illegal drugs. Some items may interact with your medicine. What should I watch for while using this medication? Your condition will be monitored carefully while you are receiving this medication. This medication may make you feel generally unwell. This is not uncommon as chemotherapy can affect healthy cells as well as cancer cells. Report any side effects. Continue your course of treatment even though you feel ill unless your care team tells you to stop. In some cases, you may be given additional medications to help with side effects. Follow all directions for their use. This medication may increase your risk of getting an infection. Call your care team for advice if you get a fever, chills, sore throat, or other  symptoms of a cold or flu. Do not treat yourself. Try to avoid being around people who are sick. This medication may increase your risk to bruise or bleed. Call your care team if you notice any unusual bleeding. Be careful brushing or flossing your teeth or using a toothpick because you may get an infection or bleed more easily. If you have any dental work done, tell your dentist you are receiving this medication. Avoid taking medications that contain aspirin, acetaminophen, ibuprofen, naproxen, or ketoprofen unless instructed by your care team. These medications may hide a fever. Do not treat diarrhea with over the counter products. Contact your care team if you have diarrhea that lasts more than 2 days or if it is severe and watery. This medication can make you more sensitive to the sun. Keep out of the sun. If you cannot avoid being in the sun, wear protective clothing and sunscreen. Do not use sun lamps, tanning beds, or tanning booths. Talk to your care team if you or your partner wish to become pregnant or think  you might be pregnant. This medication can cause serious birth defects if taken during pregnancy and for 3 months after the last dose. A reliable form of contraception is recommended while taking this medication and for 3 months after the last dose. Talk to your care team about effective forms of contraception. Do not father a child while taking this medication and for 3 months after the last dose. Use a condom while having sex during this time period. Do not breastfeed while taking this medication. This medication may cause infertility. Talk to your care team if you are concerned about your fertility. What side effects may I notice from receiving this medication? Side effects that you should report to your care team as soon as possible: Allergic reactions--skin rash, itching, hives, swelling of the face, lips, tongue, or throat Heart attack--pain or tightness in the chest, shoulders, arms,  or jaw, nausea, shortness of breath, cold or clammy skin, feeling faint or lightheaded Heart failure--shortness of breath, swelling of the ankles, feet, or hands, sudden weight gain, unusual weakness or fatigue Heart rhythm changes--fast or irregular heartbeat, dizziness, feeling faint or lightheaded, chest pain, trouble breathing High ammonia level--unusual weakness or fatigue, confusion, loss of appetite, nausea, vomiting, seizures Infection--fever, chills, cough, sore throat, wounds that don't heal, pain or trouble when passing urine, general feeling of discomfort or being unwell Low red blood cell level--unusual weakness or fatigue, dizziness, headache, trouble breathing Pain, tingling, or numbness in the hands or feet, muscle weakness, change in vision, confusion or trouble speaking, loss of balance or coordination, trouble walking, seizures Redness, swelling, and blistering of the skin over hands and feet Severe or prolonged diarrhea Unusual bruising or bleeding Side effects that usually do not require medical attention (report to your care team if they continue or are bothersome): Dry skin Headache Increased tears Nausea Pain, redness, or swelling with sores inside the mouth or throat Sensitivity to light Vomiting This list may not describe all possible side effects. Call your doctor for medical advice about side effects. You may report side effects to FDA at 1-800-FDA-1088. Where should I keep my medication? This medication is given in a hospital or clinic. It will not be stored at home. NOTE: This sheet is a summary. It may not cover all possible information. If you have questions about this medicine, talk to your doctor, pharmacist, or health care provider.  2024 Elsevier/Gold Standard (2021-11-02 00:00:00)        To help prevent nausea and vomiting after your treatment, we encourage you to take your nausea medication as directed.  BELOW ARE SYMPTOMS THAT SHOULD BE REPORTED  IMMEDIATELY: *FEVER GREATER THAN 100.4 F (38 C) OR HIGHER *CHILLS OR SWEATING *NAUSEA AND VOMITING THAT IS NOT CONTROLLED WITH YOUR NAUSEA MEDICATION *UNUSUAL SHORTNESS OF BREATH *UNUSUAL BRUISING OR BLEEDING *URINARY PROBLEMS (pain or burning when urinating, or frequent urination) *BOWEL PROBLEMS (unusual diarrhea, constipation, pain near the anus) TENDERNESS IN MOUTH AND THROAT WITH OR WITHOUT PRESENCE OF ULCERS (sore throat, sores in mouth, or a toothache) UNUSUAL RASH, SWELLING OR PAIN  UNUSUAL VAGINAL DISCHARGE OR ITCHING   Items with * indicate a potential emergency and should be followed up as soon as possible or go to the Emergency Department if any problems should occur.  Please show the CHEMOTHERAPY ALERT CARD or IMMUNOTHERAPY ALERT CARD at check-in to the Emergency Department and triage nurse.  Should you have questions after your visit or need to cancel or reschedule your appointment, please contact [facility] AT   [phone]  and follow the prompts.  Office hours are 8:00 a.m. to 4:30 p.m. Monday - Friday. Please note that voicemails left after 4:00 p.m. may not be returned until the following business day.  We are closed weekends and major holidays. You have access to a nurse at all times for urgent questions. Please call the main number to the clinic [phone] and follow the prompts.  For any non-urgent questions, you may also contact your provider using MyChart. We now offer e-Visits for anyone 29 and older to request care online for non-urgent symptoms. For details visit [email or web address].   Also download the MyChart app! Go to the app store, search "MyChart", open the app, select [facility], and log in with your MyChart username and password.

## 2023-02-07 NOTE — Progress Notes (Signed)
Patient presents today for chemotherapy infusion. Patient is in satisfactory condition with no new complaints voiced.  Vital signs are stable.  Labs reviewed by Dr. Ellin Saba during the office visit and all labs are within treatment parameters.  We will proceed with treatment per MD orders.   Patient tolerated treatment well with no complaints voiced.  Home infusion 5FU pump connected with no issues.  Patient left ambulatory in stable condition.  Vital signs stable at discharge.  Follow up as scheduled.

## 2023-02-09 ENCOUNTER — Inpatient Hospital Stay: Payer: MEDICAID | Attending: Hematology

## 2023-02-09 VITALS — BP 130/75 | HR 72 | Temp 98.3°F | Resp 16

## 2023-02-09 DIAGNOSIS — F32A Depression, unspecified: Secondary | ICD-10-CM | POA: Diagnosis not present

## 2023-02-09 DIAGNOSIS — Z5111 Encounter for antineoplastic chemotherapy: Secondary | ICD-10-CM | POA: Diagnosis present

## 2023-02-09 DIAGNOSIS — C20 Malignant neoplasm of rectum: Secondary | ICD-10-CM | POA: Diagnosis present

## 2023-02-09 DIAGNOSIS — D696 Thrombocytopenia, unspecified: Secondary | ICD-10-CM | POA: Diagnosis not present

## 2023-02-09 DIAGNOSIS — Z5189 Encounter for other specified aftercare: Secondary | ICD-10-CM | POA: Insufficient documentation

## 2023-02-09 DIAGNOSIS — R112 Nausea with vomiting, unspecified: Secondary | ICD-10-CM

## 2023-02-09 DIAGNOSIS — Z452 Encounter for adjustment and management of vascular access device: Secondary | ICD-10-CM | POA: Diagnosis not present

## 2023-02-09 MED ORDER — HEPARIN SOD (PORK) LOCK FLUSH 100 UNIT/ML IV SOLN
500.0000 [IU] | Freq: Once | INTRAVENOUS | Status: AC | PRN
  Administered 2023-02-09: 500 [IU]

## 2023-02-09 MED ORDER — PROMETHAZINE HCL 25 MG RE SUPP: 25.0000 mg | Freq: Four times a day (QID) | RECTAL | 1 refills | Status: AC | PRN

## 2023-02-09 MED ORDER — SODIUM CHLORIDE 0.9% FLUSH
10.0000 mL | INTRAVENOUS | Status: DC | PRN
  Administered 2023-02-09: 10 mL

## 2023-02-09 MED ORDER — ONDANSETRON 8 MG PO TBDP
8.0000 mg | ORAL_TABLET | Freq: Once | ORAL | Status: AC
  Administered 2023-02-09: 8 mg via ORAL
  Filled 2023-02-09: qty 1

## 2023-02-09 MED ORDER — PEGFILGRASTIM-JMDB 6 MG/0.6ML ~~LOC~~ SOSY
6.0000 mg | PREFILLED_SYRINGE | Freq: Once | SUBCUTANEOUS | Status: AC
  Administered 2023-02-09: 6 mg via SUBCUTANEOUS
  Filled 2023-02-09: qty 0.6

## 2023-02-09 MED ORDER — LORAZEPAM 2 MG/ML PO CONC: 1.0000 mg | Freq: Once | ORAL | Status: DC

## 2023-02-09 MED ORDER — POTASSIUM CHLORIDE IN NACL 20-0.9 MEQ/L-% IV SOLN
INTRAVENOUS | Status: DC
  Filled 2023-02-09 (×2): qty 1000

## 2023-02-09 MED ORDER — MAGNESIUM SULFATE 2 GM/50ML IV SOLN
2.0000 g | Freq: Once | INTRAVENOUS | Status: AC
  Administered 2023-02-09: 2 g via INTRAVENOUS
  Filled 2023-02-09: qty 50

## 2023-02-09 MED ORDER — LORAZEPAM 2 MG/ML IJ SOLN
1.0000 mg | Freq: Once | INTRAMUSCULAR | Status: AC
  Administered 2023-02-09: 1 mg via INTRAVENOUS
  Filled 2023-02-09: qty 1

## 2023-02-09 NOTE — Patient Instructions (Addendum)
[facility]  Discharge Instructions: Thank you for choosing [facility] to provide your oncology and hematology care.  If you have a lab appointment with the Cancer Center - please note that after April 8th, 2024, all labs will be drawn in the cancer center.  You do not have to check in or register with the main entrance as you have in the past but will complete your check-in in the cancer center.  Wear comfortable clothing and clothing appropriate for easy access to any Portacath or PICC line.   We strive to give you quality time with your provider. You may need to reschedule your appointment if you arrive late (15 or more minutes).  Arriving late affects you and other patients whose appointments are after yours.  Also, if you miss three or more appointments without notifying the office, you may be dismissed from the clinic at the provider's discretion.      For prescription refill requests, have your pharmacy contact our office and allow 72 hours for refills to be completed.    Today you received the following Fulphila injection and IVF.  Pegfilgrastim Injection What is this medication? PEGFILGRASTIM (PEG fil gra stim) lowers the risk of infection in people who are receiving chemotherapy. It works by Systems analyst make more white blood cells, which protects your body from infection. It may also be used to help people who have been exposed to high doses of radiation. This medicine may be used for other purposes; ask your health care provider or pharmacist if you have questions. COMMON BRAND NAME(S): Cherly Hensen, Neulasta, Nyvepria, Stimufend, UDENYCA, UDENYCA ONBODY, Ziextenzo What should I tell my care team before I take this medication? They need to know if you have any of these conditions: Kidney disease Latex allergy Ongoing radiation therapy Sickle cell disease Skin reactions to acrylic adhesives (On-Body Injector only) An unusual or  allergic reaction to pegfilgrastim, filgrastim, other medications, foods, dyes, or preservatives Pregnant or trying to get pregnant Breast-feeding How should I use this medication? This medication is for injection under the skin. If you get this medication at home, you will be taught how to prepare and give the pre-filled syringe or how to use the On-body Injector. Refer to the patient Instructions for Use for detailed instructions. Use exactly as directed. Tell your care team immediately if you suspect that the On-body Injector may not have performed as intended or if you suspect the use of the On-body Injector resulted in a missed or partial dose. It is important that you put your used needles and syringes in a special sharps container. Do not put them in a trash can. If you do not have a sharps container, call your pharmacist or care team to get one. Talk to your care team about the use of this medication in children. While this medication may be prescribed for selected conditions, precautions do apply. Overdosage: If you think you have taken too much of this medicine contact a poison control center or emergency room at once. NOTE: This medicine is only for you. Do not share this medicine with others. What if I miss a dose? It is important not to miss your dose. Call your care team if you miss your dose. If you miss a dose due to an On-body Injector failure or leakage, a new dose should be administered as soon as possible using a single prefilled syringe for manual use. What may interact with this medication? Interactions have  not been studied. This list may not describe all possible interactions. Give your health care provider a list of all the medicines, herbs, non-prescription drugs, or dietary supplements you use. Also tell them if you smoke, drink alcohol, or use illegal drugs. Some items may interact with your medicine. What should I watch for while using this medication? Your condition will  be monitored carefully while you are receiving this medication. You may need blood work done while you are taking this medication. Talk to your care team about your risk of cancer. You may be more at risk for certain types of cancer if you take this medication. If you are going to need a MRI, CT scan, or other procedure, tell your care team that you are using this medication (On-Body Injector only). What side effects may I notice from receiving this medication? Side effects that you should report to your care team as soon as possible: Allergic reactions--skin rash, itching, hives, swelling of the face, lips, tongue, or throat Capillary leak syndrome--stomach or muscle pain, unusual weakness or fatigue, feeling faint or lightheaded, decrease in the amount of urine, swelling of the ankles, hands, or feet, trouble breathing High white blood cell level--fever, fatigue, trouble breathing, night sweats, change in vision, weight loss Inflammation of the aorta--fever, fatigue, back, chest, or stomach pain, severe headache Kidney injury (glomerulonephritis)--decrease in the amount of urine, red or dark brown urine, foamy or bubbly urine, swelling of the ankles, hands, or feet Shortness of breath or trouble breathing Spleen injury--pain in upper left stomach or shoulder Unusual bruising or bleeding Side effects that usually do not require medical attention (report to your care team if they continue or are bothersome): Bone pain Pain in the hands or feet This list may not describe all possible side effects. Call your doctor for medical advice about side effects. You may report side effects to FDA at 1-800-FDA-1088. Where should I keep my medication? Keep out of the reach of children. If you are using this medication at home, you will be instructed on how to store it. Throw away any unused medication after the expiration date on the label. NOTE: This sheet is a summary. It may not cover all possible  information. If you have questions about this medicine, talk to your doctor, pharmacist, or health care provider.  2024 Elsevier/Gold Standard (2021-05-28 00:00:00)     To help prevent nausea and vomiting after your treatment, we encourage you to take your nausea medication as directed.  BELOW ARE SYMPTOMS THAT SHOULD BE REPORTED IMMEDIATELY: *FEVER GREATER THAN 100.4 F (38 C) OR HIGHER *CHILLS OR SWEATING *NAUSEA AND VOMITING THAT IS NOT CONTROLLED WITH YOUR NAUSEA MEDICATION *UNUSUAL SHORTNESS OF BREATH *UNUSUAL BRUISING OR BLEEDING *URINARY PROBLEMS (pain or burning when urinating, or frequent urination) *BOWEL PROBLEMS (unusual diarrhea, constipation, pain near the anus) TENDERNESS IN MOUTH AND THROAT WITH OR WITHOUT PRESENCE OF ULCERS (sore throat, sores in mouth, or a toothache) UNUSUAL RASH, SWELLING OR PAIN  UNUSUAL VAGINAL DISCHARGE OR ITCHING   Items with * indicate a potential emergency and should be followed up as soon as possible or go to the Emergency Department if any problems should occur.  Please show the CHEMOTHERAPY ALERT CARD or IMMUNOTHERAPY ALERT CARD at check-in to the Emergency Department and triage nurse.  Should you have questions after your visit or need to cancel or reschedule your appointment, please contact [facility] [phone]  and follow the prompts.  Office hours are 8:00 a.m. to  4:30 p.m. Monday - Friday. Please note that voicemails left after 4:00 p.m. may not be returned until the following business day.  We are closed weekends and major holidays. You have access to a nurse at all times for urgent questions. Please call the main number to the clinic [phone] and follow the prompts.  For any non-urgent questions, you may also contact your provider using MyChart. We now offer e-Visits for anyone 51 and older to request care online for non-urgent symptoms. For details visit [email or web address].   Also download the MyChart  app! Go to the app store, search "MyChart", open the app, select [place], and log in with your MyChart username and password.

## 2023-02-09 NOTE — Progress Notes (Signed)
Patient Here to have 5FU pump disconnected and Fulphila injection. Pump disconnected Patients port flushed without difficulty.  Good blood return noted with no bruising or swelling noted at site.  Band aid applied.  VSS. Patient did report some nausea and vomiting x2 days with intermittent dizziness upon standing but stated he has been taking his compazine at home. Patient noted to go to bathroom and vomit shortly after pump removed. Dr Ellin Saba made aware and patient to have Zofran 8mg  ODT with PO challenge 30 minutes after. Patient given Fulphilia injection per Dr Dellia Cloud approval. Patient tolerated injection with no complaints voiced.  Site clean and dry with no bruising or swelling noted.  No complaints of pain.  Patient vomited with PO challenge. Dr Ellin Saba made aware. Patient's port re-accessed and patient given 1mg  of Ativan IV, Magnesium 2g IV, and a bolus of potassium chloride 20 mEq in normal saline per Dr Marice Potter order.   Patient tolerated treatment well with no complaints voiced.  Patient reports improvement in nausea. Patient left ambulatory in stable condition.  Vital signs stable at discharge.  Follow up as scheduled.

## 2023-02-13 ENCOUNTER — Telehealth: Payer: Self-pay | Admitting: *Deleted

## 2023-02-13 NOTE — Telephone Encounter (Signed)
Patient called after hours nurse line on 8/2 to advise of decreased urine output, nausea, vomiting and sweating.  Denied fever.  Also c/o dizziness and confusion, associated with poor food an fluid intake.  He was advised to go to the ER, however did not present.  Spoke to mother Billie Ruddy, who confirmed that he is feeling much better as of today.  Advised that she call us back if he is in need of fluids or medication.  Verbalized understanding.

## 2023-02-16 ENCOUNTER — Telehealth: Payer: Self-pay | Admitting: Hematology

## 2023-02-20 NOTE — Progress Notes (Signed)
[facility] [address], [place] [address]    Clinic Day:  02/21/2023   Referring physician: Marlise Eves, MD  Patient Care Team: Patient, No Pcp Per as PCP - General (General Practice) Doreatha Massed, MD as Medical Oncologist (Medical Oncology) Therese Sarah, RN as Oncology Nurse Navigator (Medical Oncology)   ASSESSMENT & PLAN:   Assessment: 1.  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 - Colonoscopy and biopsy (10/13/2022): Adenocarcinoma.  MMR preserved. - Evaluated at [facility] care. - Pelvic MRI (10/21/2022): A semi-annular ulcerated T3 N1 low rectal tumor with mucinous features sitting at or just above the level of the sphincter complex without demonstrable infiltration of the sphincter complex.  Lymph node measures 5 mm in short axis with minimum distance 5 mm from MRF at 9:00.  Several additional small rounded lymph nodes seen in the vicinity which fall below the threshold.  No demonstrable extra mesorectal pelvic metastatic disease. - Cycle 1 of FOLFOX started on 11/29/2022 - Germline mutation ([facility] genetics) [facility] 1 VUS   2.  Social/family history: - [name]  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  Quit smoking 6 months ago.  Smoked half to 1 pack/day starting at age 6. - Sister had stomach cancer.  Another sister had colon polyps removed.  Maternal aunt had lung cancer.  Maternal great grandfather had stomach cancer.  Paternal grandfather had bone cancer.  Paternal aunt had skin cancer.  Another paternal aunt had breast cancer.  Paternal uncle had lung cancer.    Plan: 1.  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 - Reviewed labs today: AST and ALT are elevated at 47 and 51 respectively.  Creatinine is 1.23 and better.  CBC shows elevated  white count from G-CSF.  Platelet count is 118. - Continue with C7 with 20% dose reduction. - He has on and off numbness in the fingertips which is stable. - RTC 2 weeks.  Will arrange for pelvic MRI in 4 weeks.  He already met with Dr. Langston Masker.  Will make arrangements for chemoradiation therapy after completion of cycle 8. - I have discussed germline mutation testing which was negative.   2.  Throbbing rectal pain: - This has improved significantly.  He did not have any rectal pain today.  Did not require a tramadol in the last 1 week.  3.  Depression: - Continue Lexapro 10 mg daily which is helping.    Orders Placed This Encounter  Procedures   MR PELVIS WO CM RECTAL CA STAGING    Standing Status:   Future    Standing Expiration Date:   02/21/2024    Order Specific Question:   If indicated for the ordered procedure, I authorize the administration of contrast media per Radiology protocol    Answer:   Yes    Order Specific Question:   What is the patient's sedation requirement?    Answer:   No Sedation    Order Specific Question:   Does the patient have a pacemaker or implanted devices?    Answer:   No    Order Specific Question:   Preferred imaging location?    Answer:   [facility] (table limit - 550lbs)       Alben Deeds Teague,acting as a  scribe for Doreatha Massed, MD.,have documented all relevant documentation on the behalf of Doreatha Massed, MD,as directed by  Doreatha Massed, MD while in the presence of Doreatha Massed, MD.  I, Doreatha Massed MD, have reviewed the above documentation for accuracy and completeness, and I agree with the above.      Doreatha Massed, MD   8/13/20245:43 PM  CHIEF COMPLAINT:   Diagnosis: rectal adenocarcinoma    Cancer Staging  Rectal adenocarcinoma [facility]) Staging form: Colon and Rectum, AJCC 8th Edition - Clinical stage from 11/18/2022: Stage IIIB (cT3, cN1b, cM0) - Unsigned    Prior Therapy:  none  Current Therapy:  neoadjuvant FOLFOX    HISTORY OF PRESENT ILLNESS:   Oncology History  Rectal adenocarcinoma (HCC)  11/18/2022 Initial Diagnosis   Rectal adenocarcinoma (HCC)   11/29/2022 -  Chemotherapy   Patient is on Treatment Plan : COLORECTAL FOLFOX q14d x 4 months     01/04/2023 Genetic Testing   Negative genetic testing on the CancerNext-Expanded+RNAinsight.  CDH1 p.V495A (c.1484T>C) VUS was found.  The report date is January 04, 2023.  The CancerNext-Expanded gene panel offered by [facility] and includes sequencing and rearrangement analysis for the following 77 genes: AIP, ALK, APC*, ATM*, AXIN2, BAP1, BARD1, BMPR1A, BRCA1*, BRCA2*, BRIP1*, CDC73, CDH1*, CDK4, CDKN1B, CDKN2A, CHEK2*, CTNNA1, DICER1, FH, FLCN, KIF1B, LZTR1, MAX, MEN1, MET, MLH1*, MSH2*, MSH3, MSH6*, MUTYH*, NF1*, NF2, NTHL1, PALB2*, PHOX2B, PMS2*, POT1, PRKAR1A, PTCH1, PTEN*, RAD51C*, RAD51D*, RB1, RET, SDHA, SDHAF2, SDHB, SDHC, SDHD, SMAD4, SMARCA4, SMARCB1, SMARCE1, STK11, SUFU, TMEM127, TP53*, TSC1, TSC2, and VHL (sequencing and deletion/duplication); EGFR, EGLN1, HOXB13, KIT, MITF, PDGFRA, POLD1, and POLE (sequencing only); EPCAM and GREM1 (deletion/duplication only). DNA and RNA analyses performed for * genes.       INTERVAL HISTORY:   Zachary Nelson is a 47 y.o. male presenting to clinic today for follow up of rectal adenocarcinoma. He was last seen by me on 02/07/23.  Today, he states that he is doing well overall. His appetite level is at 100%. His energy level is at 68%.  PAST MEDICAL HISTORY:   Past Medical History: Past Medical History:  Diagnosis Date   Cancer (HCC)    Family history of breast cancer    Family history of stomach cancer     Surgical History: Past Surgical History:  Procedure Laterality Date   ANKLE FRACTURE SURGERY     FOOT SURGERY     IR IMAGING GUIDED PORT INSERTION  11/25/2022    Social History: Social History   Socioeconomic History   Marital status: Divorced     Spouse name: Not on file   Number of children: Not on file   Years of education: Not on file   Highest education level: Not on file  Occupational History   Not on file  Tobacco Use   Smoking status: Some Days    Types: Cigars    Last attempt to quit: 06/10/2022    Years since quitting: 0.7   Smokeless tobacco: Never  Vaping Use   Vaping status: Some Days  Substance and Sexual Activity   Alcohol use: Yes   Drug use: Not Currently   Sexual activity: Not on file  Other Topics Concern   Not on file  Social History Narrative   ** Merged History Encounter **       Social Determinants of Health   Financial Resource Strain: High Risk (11/28/2022)   Overall Financial Resource Strain (CARDIA)    Difficulty of Paying Living Expenses: Very hard  Food Insecurity: No Food Insecurity (11/22/2022)   Hunger Vital Sign    Worried About Running Out of Food in the Last Year: Never true    Ran Out of Food in the Last Year: Never true  Transportation Needs: No Transportation Needs (11/22/2022)   PRAPARE - Administrator, Civil Service (Medical): No    Lack of Transportation (Non-Medical): No  Physical Activity: Not on file  Stress: Not on file  Social Connections: Not on file  Intimate Partner Violence: Not At Risk (11/22/2022)   Humiliation, Afraid, Rape, and Kick questionnaire    Fear of Current or Ex-Partner: No    Emotionally Abused: No    Physically Abused: No    Sexually Abused: No    Family History: Family History  Problem Relation Age of Onset   Stomach cancer Sister 41 - 59   Other Sister        ovary surgery   Cancer Maternal Aunt        NOS   Breast cancer Paternal Aunt        dx.>50   Melanoma Paternal Aunt 74 - 59   Aneurysm Paternal Grandmother    Bone cancer Paternal Grandfather        dx.>50   Cancer Cousin 58       pat first cousin with NOS cancer    Current Medications:  Current Outpatient Medications:    dextrose 5 % SOLN 1,000 mL with  fluorouracil 5 GM/100ML SOLN, Inject into the vein over 48 hr. Every 14 days, Disp: , Rfl:    escitalopram (LEXAPRO) 10 MG tablet, Take 1 tablet (10 mg total) by mouth daily., Disp: 30 tablet, Rfl: 2   FLUOROURACIL IV, Inject into the vein every 14 (fourteen) days., Disp: , Rfl:    HYDROcodone-acetaminophen (NORCO/VICODIN) 5-325 MG tablet, Take 2 tablets by mouth every 6 (six) hours as needed for severe pain., Disp: 12 tablet, Rfl: 0   LEUCOVORIN CALCIUM IV, Inject into the vein every 14 (fourteen) days., Disp: , Rfl:    lidocaine (XYLOCAINE) 2 % solution, Use as directed 15 mLs in the mouth or throat as needed for mouth pain., Disp: 10 mL, Rfl: 1   Lidocaine 3 % CREA, Apply 1 Application topically 3 (three) times daily as needed., Disp: 28.35 g, Rfl: 0   lidocaine-prilocaine (EMLA) cream, Apply a quarter-sized amount to port a cath site and cover with plastic wrap 1 hour prior to infusion appointments, Disp: 30 g, Rfl: 3   OXALIPLATIN IV, Inject into the vein every 14 (fourteen) days., Disp: , Rfl:    prochlorperazine (COMPAZINE) 10 MG tablet, Take 1 tablet (10 mg total) by mouth every 6 (six) hours as needed for nausea or vomiting., Disp: 60 tablet, Rfl: 4   promethazine (PHENERGAN) 25 MG suppository, Place 1 suppository (25 mg total) rectally every 6 (six) hours as needed for nausea or vomiting., Disp: 12 each, Rfl: 1 No current facility-administered medications for this visit.  Facility-Administered Medications Ordered in Other Visits:    fluorouracil (ADRUCIL) 4,200 mg in sodium chloride 0.9 % 66 mL chemo infusion, 1,920 mg/m2 (Treatment Plan Recorded), Intravenous, 1 day or 1 dose, Doreatha Massed, MD, Infusion Verify at 02/21/23 1516   Allergies: Allergies  Allergen Reactions   Bee Venom     REVIEW OF SYSTEMS:   Review of Systems  Constitutional:  Negative for chills, fatigue and fever.  HENT:   Negative for lump/mass, mouth sores, nosebleeds, sore throat and trouble swallowing.  Eyes:  Negative for eye problems.  Respiratory:  Positive for shortness of breath. Negative for cough.   Cardiovascular:  Negative for chest pain, leg swelling and palpitations.  Gastrointestinal:  Negative for abdominal pain, constipation, diarrhea, nausea and vomiting.  Genitourinary:  Negative for bladder incontinence, difficulty urinating, dysuria, frequency, hematuria and nocturia.   Musculoskeletal:  Negative for arthralgias, back pain, flank pain, myalgias and neck pain.  Skin:  Negative for itching and rash.  Neurological:  Positive for numbness. Negative for dizziness and headaches.  Hematological:  Does not bruise/bleed easily.  Psychiatric/Behavioral:  Negative for depression, sleep disturbance and suicidal ideas. The patient is not nervous/anxious.   All other systems reviewed and are negative.    VITALS:   There were no vitals taken for this visit.  Wt Readings from Last 3 Encounters:  02/21/23 193 lb 12.8 oz (87.9 kg)  02/07/23 197 lb 3.2 oz (89.4 kg)  01/25/23 203 lb 3.2 oz (92.2 kg)    There is no height or weight on file to calculate BMI.  Performance status (ECOG): 1 - Symptomatic but completely ambulatory  PHYSICAL EXAM:   Physical Exam Vitals and nursing note reviewed. Exam conducted with a chaperone present.  Constitutional:      Appearance: Normal appearance.  Cardiovascular:     Rate and Rhythm: Normal rate and regular rhythm.     Pulses: Normal pulses.     Heart sounds: Normal heart sounds.  Pulmonary:     Effort: Pulmonary effort is normal.     Breath sounds: Normal breath sounds.  Abdominal:     Palpations: Abdomen is soft. There is no hepatomegaly, splenomegaly or mass.     Tenderness: There is no abdominal tenderness.  Musculoskeletal:     Right lower leg: No edema.     Left lower leg: No edema.  Lymphadenopathy:     Cervical: No cervical adenopathy.     Right cervical: No superficial, deep or posterior cervical adenopathy.    Left  cervical: No superficial, deep or posterior cervical adenopathy.     Upper Body:     Right upper body: No supraclavicular or axillary adenopathy.     Left upper body: No supraclavicular or axillary adenopathy.  Neurological:     General: No focal deficit present.     Mental Status: He is alert and oriented to person, place, and time.  Psychiatric:        Mood and Affect: Mood normal.        Behavior: Behavior normal.     LABS:      Latest Ref Rng & Units 02/21/2023    9:23 AM 02/07/2023    8:21 AM 01/25/2023   10:49 AM  CBC  WBC 4.0 - 10.5 K/uL 14.6  8.0  10.0   Hemoglobin 13.0 - 17.0 g/dL 57.8  46.9  62.9   Hematocrit 39.0 - 52.0 % 36.8  34.9  35.4   Platelets 150 - 400 K/uL 118  100  100       Latest Ref Rng & Units 02/21/2023    9:23 AM 02/07/2023    8:21 AM 01/24/2023    1:18 PM  CMP  Glucose 70 - 99 mg/dL 89  93  528   BUN 6 - 20 mg/dL 13  14  14    Creatinine 0.61 - 1.24 mg/dL 4.13  2.44  0.10   Sodium 135 - 145 mmol/L 134  133  138   Potassium 3.5 - 5.1 mmol/L 3.8  3.8  4.0   Chloride 98 - 111 mmol/L 103  106  107   CO2 22 - 32 mmol/L 26  25  25    Calcium 8.9 - 10.3 mg/dL 9.1  8.6  9.1   Total Protein 6.5 - 8.1 g/dL 7.5  7.4  7.4   Total Bilirubin 0.3 - 1.2 mg/dL 0.4  0.8  0.5   Alkaline Phos 38 - 126 U/L 138  88  71   AST 15 - 41 U/L 47  40  52   ALT 0 - 44 U/L 51  39  67      Lab Results  Component Value Date   CEA1 3.1 11/29/2022   /  CEA  Date Value Ref Range Status  11/29/2022 3.1 0.0 - 4.7 ng/mL Final    Comment:    (NOTE)                             Nonsmokers          <3.9                             Smokers             <5.6 Roche [employer] Electrochemiluminescence Immunoassay (ECLIA) Values obtained with different assay methods or kits cannot be used interchangeably.  Results cannot be interpreted as absolute evidence of the presence or absence of malignant disease. Performed At: [facility] [address] [place], [place]  [id number] Jolene Schimke MD PI:[id number]    No results found for: "PSA1" No results found for: "[phone]" No results found for: "CAN125"  No results found for: "TOTALPROTELP", "ALBUMINELP", "A1GS", "A2GS", "BETS", "BETA2SER", "GAMS", "MSPIKE", "SPEI" No results found for: "TIBC", "FERRITIN", "IRONPCTSAT" No results found for: "LDH"   STUDIES:   No results found.

## 2023-02-21 ENCOUNTER — Encounter: Payer: Self-pay | Admitting: Hematology

## 2023-02-21 ENCOUNTER — Inpatient Hospital Stay (HOSPITAL_BASED_OUTPATIENT_CLINIC_OR_DEPARTMENT_OTHER): Payer: MEDICAID | Admitting: Hematology

## 2023-02-21 ENCOUNTER — Inpatient Hospital Stay: Payer: MEDICAID

## 2023-02-21 VITALS — BP 104/60 | HR 66 | Temp 96.5°F | Resp 18

## 2023-02-21 DIAGNOSIS — Z5111 Encounter for antineoplastic chemotherapy: Secondary | ICD-10-CM | POA: Diagnosis not present

## 2023-02-21 DIAGNOSIS — C20 Malignant neoplasm of rectum: Secondary | ICD-10-CM | POA: Diagnosis not present

## 2023-02-21 LAB — COMPREHENSIVE METABOLIC PANEL
ALT: 51 U/L — ABNORMAL HIGH (ref 0–44)
AST: 47 U/L — ABNORMAL HIGH (ref 15–41)
Albumin: 3.8 g/dL (ref 3.5–5.0)
Alkaline Phosphatase: 138 U/L — ABNORMAL HIGH (ref 38–126)
Anion gap: 5 (ref 5–15)
BUN: 13 mg/dL (ref 6–20)
CO2: 26 mmol/L (ref 22–32)
Calcium: 9.1 mg/dL (ref 8.9–10.3)
Chloride: 103 mmol/L (ref 98–111)
Creatinine, Ser: 1.23 mg/dL (ref 0.61–1.24)
GFR, Estimated: >60 mL/min (ref >60)
Glucose, Bld: 89 mg/dL (ref 70–99)
Potassium: 3.8 mmol/L (ref 3.5–5.1)
Sodium: 134 mmol/L — ABNORMAL LOW (ref 135–145)
Total Bilirubin: 0.4 mg/dL (ref 0.3–1.2)
Total Protein: 7.5 g/dL (ref 6.5–8.1)

## 2023-02-21 LAB — CBC WITH DIFFERENTIAL/PLATELET
Abs Immature Granulocytes: 1.3 10*3/uL — ABNORMAL HIGH (ref 0.00–0.07)
Basophils Absolute: 0 10*3/uL (ref 0.0–0.1)
Basophils Relative: 0 %
Eosinophils Absolute: 0.1 10*3/uL (ref 0.0–0.5)
Eosinophils Relative: 1 %
HCT: 36.8 % — ABNORMAL LOW (ref 39.0–52.0)
Hemoglobin: 11.9 g/dL — ABNORMAL LOW (ref 13.0–17.0)
Lymphocytes Relative: 21 %
Lymphs Abs: 3.1 10*3/uL (ref 0.7–4.0)
MCH: 30.1 pg (ref 26.0–34.0)
MCHC: 32.3 g/dL (ref 30.0–36.0)
MCV: 92.9 fL (ref 80.0–100.0)
Metamyelocytes Relative: 8 %
Monocytes Absolute: 1.3 10*3/uL — ABNORMAL HIGH (ref 0.1–1.0)
Monocytes Relative: 9 %
Myelocytes: 1 %
Neutro Abs: 8.8 10*3/uL — ABNORMAL HIGH (ref 1.7–7.7)
Neutrophils Relative %: 60 %
Platelets: 118 10*3/uL — ABNORMAL LOW (ref 150–400)
RBC: 3.96 MIL/uL — ABNORMAL LOW (ref 4.22–5.81)
RDW: 18 % — ABNORMAL HIGH (ref 11.5–15.5)
WBC: 14.6 10*3/uL — ABNORMAL HIGH (ref 4.0–10.5)
nRBC: 0.7 % — ABNORMAL HIGH (ref 0.0–0.2)

## 2023-02-21 LAB — MAGNESIUM: Magnesium: 1.9 mg/dL (ref 1.7–2.4)

## 2023-02-21 MED ORDER — DEXTROSE 5 % IV SOLN: Freq: Once | INTRAVENOUS | Status: AC

## 2023-02-21 MED ORDER — SODIUM CHLORIDE 0.9 % IV SOLN
10.0000 mg | Freq: Once | INTRAVENOUS | Status: AC
  Administered 2023-02-21: 10 mg via INTRAVENOUS
  Filled 2023-02-21: qty 10

## 2023-02-21 MED ORDER — OXALIPLATIN CHEMO INJECTION 100 MG/20ML
68.0000 mg/m2 | Freq: Once | INTRAVENOUS | Status: AC
  Administered 2023-02-21: 150 mg via INTRAVENOUS
  Filled 2023-02-21: qty 20

## 2023-02-21 MED ORDER — PALONOSETRON HCL INJECTION 0.25 MG/5ML
0.2500 mg | Freq: Once | INTRAVENOUS | Status: AC
  Administered 2023-02-21: 0.25 mg via INTRAVENOUS
  Filled 2023-02-21: qty 5

## 2023-02-21 MED ORDER — FLUOROURACIL CHEMO INJECTION 2.5 GM/50ML
320.0000 mg/m2 | Freq: Once | INTRAVENOUS | Status: AC
  Administered 2023-02-21: 700 mg via INTRAVENOUS
  Filled 2023-02-21: qty 14

## 2023-02-21 MED ORDER — SODIUM CHLORIDE 0.9% FLUSH
10.0000 mL | Freq: Once | INTRAVENOUS | Status: AC
  Administered 2023-02-21: 10 mL via INTRAVENOUS

## 2023-02-21 MED ORDER — LEUCOVORIN CALCIUM INJECTION 350 MG
320.0000 mg/m2 | Freq: Once | INTRAVENOUS | Status: AC
  Administered 2023-02-21: 698 mg via INTRAVENOUS
  Filled 2023-02-21: qty 34.9

## 2023-02-21 MED ORDER — SODIUM CHLORIDE 0.9 % IV SOLN
1920.0000 mg/m2 | INTRAVENOUS | Status: DC
  Administered 2023-02-21: 4200 mg via INTRAVENOUS
  Filled 2023-02-21: qty 84

## 2023-02-21 NOTE — Progress Notes (Signed)
Patient presents today for chemotherapy infusion. Patient is in satisfactory condition with no new complaints voiced.  Vital signs are stable.  Labs reviewed by Dr. Ellin Saba during the office visit and all labs are within treatment parameters.  We will proceed with treatment per MD orders.   Patient tolerated treatment well with no complaints voiced.  Home infusion 5FU pump connected with no issues.  Patient left ambulatory in stable condition.  Vital signs stable at discharge.  Follow up as scheduled.

## 2023-02-21 NOTE — Patient Instructions (Signed)
 [facility]  Discharge Instructions: Thank you for choosing [facility] to provide your oncology and hematology care.  If you have a lab appointment with the Cancer Center - please note that after April 8th, 2024, all labs will be drawn in the cancer center.  You do not have to check in or register with the main entrance as you have in the past but will complete your check-in in the cancer center.  Wear comfortable clothing and clothing appropriate for easy access to any Portacath or PICC line.   We strive to give you quality time with your provider. You may need to reschedule your appointment if you arrive late (15 or more minutes).  Arriving late affects you and other patients whose appointments are after yours.  Also, if you miss three or more appointments without notifying the office, you may be dismissed from the clinic at the provider's discretion.      For prescription refill requests, have your pharmacy contact our office and allow 72 hours for refills to be completed.    Today you received the following chemotherapy and/or immunotherapy agents Oxaliplatin/Leucovorin/5FU.  Oxaliplatin Injection What is this medication? OXALIPLATIN (ox AL i PLA tin) treats colorectal cancer. It works by slowing down the growth of cancer cells. This medicine may be used for other purposes; ask your health care provider or pharmacist if you have questions. COMMON BRAND NAME(S): Eloxatin What should I tell my care team before I take this medication? They need to know if you have any of these conditions: Heart disease History of irregular heartbeat or rhythm Liver disease Low blood cell levels (white cells, red cells, and platelets) Lung or breathing disease, such as asthma Take medications that treat or prevent blood clots Tingling of the fingers, toes, or other nerve disorder An unusual or allergic reaction to oxaliplatin, other medications, foods, dyes, or  preservatives If you or your partner are pregnant or trying to get pregnant Breast-feeding How should I use this medication? This medication is injected into a vein. It is given by your care team in a hospital or clinic setting. Talk to your care team about the use of this medication in children. Special care may be needed. Overdosage: If you think you have taken too much of this medicine contact a poison control center or emergency room at once. NOTE: This medicine is only for you. Do not share this medicine with others. What if I miss a dose? Keep appointments for follow-up doses. It is important not to miss a dose. Call your care team if you are unable to keep an appointment. What may interact with this medication? Do not take this medication with any of the following: Cisapride Dronedarone Pimozide Thioridazine This medication may also interact with the following: Aspirin and aspirin-like medications Certain medications that treat or prevent blood clots, such as warfarin, apixaban, dabigatran, and rivaroxaban Cisplatin Cyclosporine Diuretics Medications for infection, such as acyclovir, adefovir, amphotericin B, bacitracin, cidofovir, foscarnet, ganciclovir, gentamicin, pentamidine, vancomycin NSAIDs, medications for pain and inflammation, such as ibuprofen or naproxen Other medications that cause heart rhythm changes Pamidronate Zoledronic acid This list may not describe all possible interactions. Give your health care provider a list of all the medicines, herbs, non-prescription drugs, or dietary supplements you use. Also tell them if you smoke, drink alcohol, or use illegal drugs. Some items may interact with your medicine. What should I watch for while using this medication? Your condition will be monitored carefully while  you are receiving this medication. You may need blood work while taking this medication. This medication may make you feel generally unwell. This is not  uncommon as chemotherapy can affect healthy cells as well as cancer cells. Report any side effects. Continue your course of treatment even though you feel ill unless your care team tells you to stop. This medication may increase your risk of getting an infection. Call your care team for advice if you get a fever, chills, sore throat, or other symptoms of a cold or flu. Do not treat yourself. Try to avoid being around people who are sick. Avoid taking medications that contain aspirin, acetaminophen, ibuprofen, naproxen, or ketoprofen unless instructed by your care team. These medications may hide a fever. Be careful brushing or flossing your teeth or using a toothpick because you may get an infection or bleed more easily. If you have any dental work done, tell your dentist you are receiving this medication. This medication can make you more sensitive to cold. Do not drink cold drinks or use ice. Cover exposed skin before coming in contact with cold temperatures or cold objects. When out in cold weather wear warm clothing and cover your mouth and nose to warm the air that goes into your lungs. Tell your care team if you get sensitive to the cold. Talk to your care team if you or your partner are pregnant or think either of you might be pregnant. This medication can cause serious birth defects if taken during pregnancy and for 9 months after the last dose. A negative pregnancy test is required before starting this medication. A reliable form of contraception is recommended while taking this medication and for 9 months after the last dose. Talk to your care team about effective forms of contraception. Do not father a child while taking this medication and for 6 months after the last dose. Use a condom while having sex during this time period. Do not breastfeed while taking this medication and for 3 months after the last dose. This medication may cause infertility. Talk to your care team if you are concerned about  your fertility. What side effects may I notice from receiving this medication? Side effects that you should report to your care team as soon as possible: Allergic reactions--skin rash, itching, hives, swelling of the face, lips, tongue, or throat Bleeding--bloody or black, tar-like stools, vomiting blood or brown material that looks like coffee grounds, red or dark brown urine, small red or purple spots on skin, unusual bruising or bleeding Dry cough, shortness of breath or trouble breathing Heart rhythm changes--fast or irregular heartbeat, dizziness, feeling faint or lightheaded, chest pain, trouble breathing Infection--fever, chills, cough, sore throat, wounds that don't heal, pain or trouble when passing urine, general feeling of discomfort or being unwell Liver injury--right upper belly pain, loss of appetite, nausea, light-colored stool, dark yellow or brown urine, yellowing skin or eyes, unusual weakness or fatigue Low red blood cell level--unusual weakness or fatigue, dizziness, headache, trouble breathing Muscle injury--unusual weakness or fatigue, muscle pain, dark yellow or brown urine, decrease in amount of urine Pain, tingling, or numbness in the hands or feet Sudden and severe headache, confusion, change in vision, seizures, which may be signs of posterior reversible encephalopathy syndrome (PRES) Unusual bruising or bleeding Side effects that usually do not require medical attention (report to your care team if they continue or are bothersome): Diarrhea Nausea Pain, redness, or swelling with sores inside the mouth or throat Unusual weakness or  fatigue Vomiting This list may not describe all possible side effects. Call your doctor for medical advice about side effects. You may report side effects to FDA at 1-800-FDA-1088. Where should I keep my medication? This medication is given in a hospital or clinic. It will not be stored at home. NOTE: This sheet is a summary. It may not  cover all possible information. If you have questions about this medicine, talk to your doctor, pharmacist, or health care provider.  2024 Elsevier/Gold Standard (2022-04-12 00:00:00)    Leucovorin Injection What is this medication? LEUCOVORIN (loo koe VOR in) prevents side effects from certain medications, such as methotrexate. It works by increasing folate levels. This helps protect healthy cells in your body. It may also be used to treat anemia caused by low levels of folate. It can also be used with fluorouracil, a type of chemotherapy, to treat colorectal cancer. It works by increasing the effects of fluorouracil in the body. This medicine may be used for other purposes; ask your health care provider or pharmacist if you have questions. What should I tell my care team before I take this medication? They need to know if you have any of these conditions: Anemia from low levels of vitamin B12 in the blood An unusual or allergic reaction to leucovorin, folic acid, other medications, foods, dyes, or preservatives Pregnant or trying to get pregnant Breastfeeding How should I use this medication? This medication is injected into a vein or a muscle. It is given by your care team in a hospital or clinic setting. Talk to your care team about the use of this medication in children. Special care may be needed. Overdosage: If you think you have taken too much of this medicine contact a poison control center or emergency room at once. NOTE: This medicine is only for you. Do not share this medicine with others. What if I miss a dose? Keep appointments for follow-up doses. It is important not to miss your dose. Call your care team if you are unable to keep an appointment. What may interact with this medication? Capecitabine Fluorouracil Phenobarbital Phenytoin Primidone Trimethoprim;sulfamethoxazole This list may not describe all possible interactions. Give your health care provider a list of all  the medicines, herbs, non-prescription drugs, or dietary supplements you use. Also tell them if you smoke, drink alcohol, or use illegal drugs. Some items may interact with your medicine. What should I watch for while using this medication? Your condition will be monitored carefully while you are receiving this medication. This medication may increase the side effects of 5-fluorouracil. Tell your care team if you have diarrhea or mouth sores that do not get better or that get worse. What side effects may I notice from receiving this medication? Side effects that you should report to your care team as soon as possible: Allergic reactions--skin rash, itching, hives, swelling of the face, lips, tongue, or throat This list may not describe all possible side effects. Call your doctor for medical advice about side effects. You may report side effects to FDA at 1-800-FDA-1088. Where should I keep my medication? This medication is given in a hospital or clinic. It will not be stored at home. NOTE: This sheet is a summary. It may not cover all possible information. If you have questions about this medicine, talk to your doctor, pharmacist, or health care provider.  2024 Elsevier/Gold Standard (2021-11-30 00:00:00)    Fluorouracil Injection What is this medication? FLUOROURACIL (flure oh YOOR a sil) treats some types  of cancer. It works by slowing down the growth of cancer cells. This medicine may be used for other purposes; ask your health care provider or pharmacist if you have questions. COMMON BRAND NAME(S): Adrucil What should I tell my care team before I take this medication? They need to know if you have any of these conditions: Blood disorders Dihydropyrimidine dehydrogenase (DPD) deficiency Infection, such as chickenpox, cold sores, herpes Kidney disease Liver disease Poor nutrition Recent or ongoing radiation therapy An unusual or allergic reaction to fluorouracil, other medications,  foods, dyes, or preservatives If you or your partner are pregnant or trying to get pregnant Breast-feeding How should I use this medication? This medication is injected into a vein. It is administered by your care team in a hospital or clinic setting. Talk to your care team about the use of this medication in children. Special care may be needed. Overdosage: If you think you have taken too much of this medicine contact a poison control center or emergency room at once. NOTE: This medicine is only for you. Do not share this medicine with others. What if I miss a dose? Keep appointments for follow-up doses. It is important not to miss your dose. Call your care team if you are unable to keep an appointment. What may interact with this medication? Do not take this medication with any of the following: Live virus vaccines This medication may also interact with the following: Medications that treat or prevent blood clots, such as warfarin, enoxaparin, dalteparin This list may not describe all possible interactions. Give your health care provider a list of all the medicines, herbs, non-prescription drugs, or dietary supplements you use. Also tell them if you smoke, drink alcohol, or use illegal drugs. Some items may interact with your medicine. What should I watch for while using this medication? Your condition will be monitored carefully while you are receiving this medication. This medication may make you feel generally unwell. This is not uncommon as chemotherapy can affect healthy cells as well as cancer cells. Report any side effects. Continue your course of treatment even though you feel ill unless your care team tells you to stop. In some cases, you may be given additional medications to help with side effects. Follow all directions for their use. This medication may increase your risk of getting an infection. Call your care team for advice if you get a fever, chills, sore throat, or other  symptoms of a cold or flu. Do not treat yourself. Try to avoid being around people who are sick. This medication may increase your risk to bruise or bleed. Call your care team if you notice any unusual bleeding. Be careful brushing or flossing your teeth or using a toothpick because you may get an infection or bleed more easily. If you have any dental work done, tell your dentist you are receiving this medication. Avoid taking medications that contain aspirin, acetaminophen, ibuprofen, naproxen, or ketoprofen unless instructed by your care team. These medications may hide a fever. Do not treat diarrhea with over the counter products. Contact your care team if you have diarrhea that lasts more than 2 days or if it is severe and watery. This medication can make you more sensitive to the sun. Keep out of the sun. If you cannot avoid being in the sun, wear protective clothing and sunscreen. Do not use sun lamps, tanning beds, or tanning booths. Talk to your care team if you or your partner wish to become pregnant or think  you might be pregnant. This medication can cause serious birth defects if taken during pregnancy and for 3 months after the last dose. A reliable form of contraception is recommended while taking this medication and for 3 months after the last dose. Talk to your care team about effective forms of contraception. Do not father a child while taking this medication and for 3 months after the last dose. Use a condom while having sex during this time period. Do not breastfeed while taking this medication. This medication may cause infertility. Talk to your care team if you are concerned about your fertility. What side effects may I notice from receiving this medication? Side effects that you should report to your care team as soon as possible: Allergic reactions--skin rash, itching, hives, swelling of the face, lips, tongue, or throat Heart attack--pain or tightness in the chest, shoulders, arms,  or jaw, nausea, shortness of breath, cold or clammy skin, feeling faint or lightheaded Heart failure--shortness of breath, swelling of the ankles, feet, or hands, sudden weight gain, unusual weakness or fatigue Heart rhythm changes--fast or irregular heartbeat, dizziness, feeling faint or lightheaded, chest pain, trouble breathing High ammonia level--unusual weakness or fatigue, confusion, loss of appetite, nausea, vomiting, seizures Infection--fever, chills, cough, sore throat, wounds that don't heal, pain or trouble when passing urine, general feeling of discomfort or being unwell Low red blood cell level--unusual weakness or fatigue, dizziness, headache, trouble breathing Pain, tingling, or numbness in the hands or feet, muscle weakness, change in vision, confusion or trouble speaking, loss of balance or coordination, trouble walking, seizures Redness, swelling, and blistering of the skin over hands and feet Severe or prolonged diarrhea Unusual bruising or bleeding Side effects that usually do not require medical attention (report to your care team if they continue or are bothersome): Dry skin Headache Increased tears Nausea Pain, redness, or swelling with sores inside the mouth or throat Sensitivity to light Vomiting This list may not describe all possible side effects. Call your doctor for medical advice about side effects. You may report side effects to FDA at 1-800-FDA-1088. Where should I keep my medication? This medication is given in a hospital or clinic. It will not be stored at home. NOTE: This sheet is a summary. It may not cover all possible information. If you have questions about this medicine, talk to your doctor, pharmacist, or health care provider.  2024 Elsevier/Gold Standard (2021-11-02 00:00:00)        To help prevent nausea and vomiting after your treatment, we encourage you to take your nausea medication as directed.  BELOW ARE SYMPTOMS THAT SHOULD BE REPORTED  IMMEDIATELY: *FEVER GREATER THAN 100.4 F (38 C) OR HIGHER *CHILLS OR SWEATING *NAUSEA AND VOMITING THAT IS NOT CONTROLLED WITH YOUR NAUSEA MEDICATION *UNUSUAL SHORTNESS OF BREATH *UNUSUAL BRUISING OR BLEEDING *URINARY PROBLEMS (pain or burning when urinating, or frequent urination) *BOWEL PROBLEMS (unusual diarrhea, constipation, pain near the anus) TENDERNESS IN MOUTH AND THROAT WITH OR WITHOUT PRESENCE OF ULCERS (sore throat, sores in mouth, or a toothache) UNUSUAL RASH, SWELLING OR PAIN  UNUSUAL VAGINAL DISCHARGE OR ITCHING   Items with * indicate a potential emergency and should be followed up as soon as possible or go to the Emergency Department if any problems should occur.  Please show the CHEMOTHERAPY ALERT CARD or IMMUNOTHERAPY ALERT CARD at check-in to the Emergency Department and triage nurse.  Should you have questions after your visit or need to cancel or reschedule your appointment, please contact [facility] AT   [phone]  and follow the prompts.  Office hours are 8:00 a.m. to 4:30 p.m. Monday - Friday. Please note that voicemails left after 4:00 p.m. may not be returned until the following business day.  We are closed weekends and major holidays. You have access to a nurse at all times for urgent questions. Please call the main number to the clinic [phone] and follow the prompts.  For any non-urgent questions, you may also contact your provider using MyChart. We now offer e-Visits for anyone 29 and older to request care online for non-urgent symptoms. For details visit [email or web address].   Also download the MyChart app! Go to the app store, search "MyChart", open the app, select [facility], and log in with your MyChart username and password.

## 2023-02-21 NOTE — Patient Instructions (Signed)

## 2023-02-21 NOTE — Progress Notes (Signed)
Patient has been examined by Dr. Katragadda. Vital signs and labs have been reviewed by MD - ANC, Creatinine, LFTs, hemoglobin, and platelets are within treatment parameters per M.D. - pt may proceed with treatment.  Primary RN and pharmacy notified.  

## 2023-02-23 ENCOUNTER — Other Ambulatory Visit: Payer: Self-pay

## 2023-02-23 ENCOUNTER — Inpatient Hospital Stay: Payer: MEDICAID

## 2023-02-23 ENCOUNTER — Emergency Department (HOSPITAL_COMMUNITY)
Admission: EM | Admit: 2023-02-23 | Discharge: 2023-02-24 | Disposition: A | Discharge status: Home / Self Care | Payer: 59 | Attending: Emergency Medicine | Admitting: Emergency Medicine

## 2023-02-23 ENCOUNTER — Encounter (HOSPITAL_COMMUNITY): Payer: Self-pay

## 2023-02-23 ENCOUNTER — Emergency Department (HOSPITAL_COMMUNITY): Payer: 59

## 2023-02-23 VITALS — BP 113/91 | HR 74 | Temp 96.7°F | Resp 18

## 2023-02-23 DIAGNOSIS — R112 Nausea with vomiting, unspecified: Secondary | ICD-10-CM | POA: Diagnosis not present

## 2023-02-23 DIAGNOSIS — Z85038 Personal history of other malignant neoplasm of large intestine: Secondary | ICD-10-CM | POA: Diagnosis not present

## 2023-02-23 DIAGNOSIS — E860 Dehydration: Secondary | ICD-10-CM | POA: Insufficient documentation

## 2023-02-23 DIAGNOSIS — C20 Malignant neoplasm of rectum: Secondary | ICD-10-CM | POA: Diagnosis not present

## 2023-02-23 DIAGNOSIS — R1032 Left lower quadrant pain: Secondary | ICD-10-CM | POA: Diagnosis not present

## 2023-02-23 DIAGNOSIS — D72829 Elevated white blood cell count, unspecified: Secondary | ICD-10-CM | POA: Insufficient documentation

## 2023-02-23 LAB — CBC WITH DIFFERENTIAL/PLATELET
Abs Immature Granulocytes: 1.2 10*3/uL — ABNORMAL HIGH (ref 0.00–0.07)
Band Neutrophils: 6 %
Basophils Absolute: 0 10*3/uL (ref 0.0–0.1)
Basophils Relative: 0 %
Eosinophils Absolute: 0 10*3/uL (ref 0.0–0.5)
Eosinophils Relative: 0 %
HCT: 40.7 % (ref 39.0–52.0)
Hemoglobin: 13.4 g/dL (ref 13.0–17.0)
Lymphocytes Relative: 2 %
Lymphs Abs: 1.2 10*3/uL (ref 0.7–4.0)
MCH: 30.9 pg (ref 26.0–34.0)
MCHC: 32.9 g/dL (ref 30.0–36.0)
MCV: 94 fL (ref 80.0–100.0)
Metamyelocytes Relative: 1 %
Monocytes Absolute: 0.6 10*3/uL (ref 0.1–1.0)
Monocytes Relative: 1 %
Myelocytes: 1 %
Neutro Abs: 54.6 10*3/uL — ABNORMAL HIGH (ref 1.7–7.7)
Neutrophils Relative %: 89 %
Platelets: 132 10*3/uL — ABNORMAL LOW (ref 150–400)
RBC: 4.33 MIL/uL (ref 4.22–5.81)
RDW: 18.1 % — ABNORMAL HIGH (ref 11.5–15.5)
Smear Review: DECREASED
WBC: 57.5 10*3/uL (ref 4.0–10.5)
nRBC: 0 % (ref 0.0–0.2)

## 2023-02-23 LAB — COMPREHENSIVE METABOLIC PANEL
ALT: 71 U/L — ABNORMAL HIGH (ref 0–44)
AST: 63 U/L — ABNORMAL HIGH (ref 15–41)
Albumin: 4.3 g/dL (ref 3.5–5.0)
Alkaline Phosphatase: 159 U/L — ABNORMAL HIGH (ref 38–126)
Anion gap: 13 (ref 5–15)
BUN: 23 mg/dL — ABNORMAL HIGH (ref 6–20)
CO2: 20 mmol/L — ABNORMAL LOW (ref 22–32)
Calcium: 9.2 mg/dL (ref 8.9–10.3)
Chloride: 103 mmol/L (ref 98–111)
Creatinine, Ser: 1.28 mg/dL — ABNORMAL HIGH (ref 0.61–1.24)
GFR, Estimated: >60 mL/min (ref >60)
Glucose, Bld: 89 mg/dL (ref 70–99)
Potassium: 4.2 mmol/L (ref 3.5–5.1)
Sodium: 136 mmol/L (ref 135–145)
Total Bilirubin: 1.3 mg/dL — ABNORMAL HIGH (ref 0.3–1.2)
Total Protein: 8.3 g/dL — ABNORMAL HIGH (ref 6.5–8.1)

## 2023-02-23 LAB — LIPASE, BLOOD: Lipase: 47 U/L (ref 11–51)

## 2023-02-23 MED ORDER — HEPARIN SOD (PORK) LOCK FLUSH 100 UNIT/ML IV SOLN
500.0000 [IU] | Freq: Once | INTRAVENOUS | Status: AC | PRN
  Administered 2023-02-23: 500 [IU]

## 2023-02-23 MED ORDER — SODIUM CHLORIDE 0.9 % IV BOLUS
1000.0000 mL | Freq: Once | INTRAVENOUS | Status: AC
  Administered 2023-02-23: 1000 mL via INTRAVENOUS

## 2023-02-23 MED ORDER — IOHEXOL 300 MG/ML  SOLN
100.0000 mL | Freq: Once | INTRAMUSCULAR | Status: AC | PRN
  Administered 2023-02-23: 100 mL via INTRAVENOUS

## 2023-02-23 MED ORDER — METOCLOPRAMIDE HCL 5 MG/ML IJ SOLN
10.0000 mg | Freq: Once | INTRAMUSCULAR | Status: AC
  Administered 2023-02-23: 10 mg via INTRAVENOUS
  Filled 2023-02-23: qty 2

## 2023-02-23 MED ORDER — FENTANYL CITRATE PF 50 MCG/ML IJ SOSY
50.0000 ug | PREFILLED_SYRINGE | Freq: Once | INTRAMUSCULAR | Status: AC
  Administered 2023-02-23: 50 ug via INTRAVENOUS
  Filled 2023-02-23: qty 1

## 2023-02-23 MED ORDER — PEGFILGRASTIM-JMDB 6 MG/0.6ML ~~LOC~~ SOSY
6.0000 mg | PREFILLED_SYRINGE | Freq: Once | SUBCUTANEOUS | Status: AC
  Administered 2023-02-23: 6 mg via SUBCUTANEOUS
  Filled 2023-02-23: qty 0.6

## 2023-02-23 MED ORDER — SODIUM CHLORIDE 0.9% FLUSH
10.0000 mL | INTRAVENOUS | Status: DC | PRN
  Administered 2023-02-23: 10 mL

## 2023-02-23 NOTE — Progress Notes (Signed)
Patient presents today for 5FU pump stop and disconnection after 46 hour continous infusion.   5FU pump deaccessed.  Patients port flushed without difficulty.  Good blood return noted with no bruising or swelling noted at site.  Needle removed intact.  Band aid applied.  Stable during administration without incident; injection site WNL; see MAR for injection details.  No questions or complaints noted at this time. VSS with discharge and left in satisfactory condition via wheelchair with no s/s of distress noted.

## 2023-02-23 NOTE — Patient Instructions (Signed)
[facility]  Discharge Instructions: Thank you for choosing [facility] to provide your oncology and hematology care.  If you have a lab appointment with the Cancer Center - please note that after April 8th, 2024, all labs will be drawn in the cancer center.  You do not have to check in or register with the main entrance as you have in the past but will complete your check-in in the cancer center.  Wear comfortable clothing and clothing appropriate for easy access to any Portacath or PICC line.   We strive to give you quality time with your provider. You may need to reschedule your appointment if you arrive late (15 or more minutes).  Arriving late affects you and other patients whose appointments are after yours.  Also, if you miss three or more appointments without notifying the office, you may be dismissed from the clinic at the provider's discretion.      For prescription refill requests, have your pharmacy contact our office and allow 72 hours for refills to be completed.    Today you received the following chemotherapy and/or immunotherapy agents Pump stop fulphila      To help prevent nausea and vomiting after your treatment, we encourage you to take your nausea medication as directed.  BELOW ARE SYMPTOMS THAT SHOULD BE REPORTED IMMEDIATELY: *FEVER GREATER THAN 100.4 F (38 C) OR HIGHER *CHILLS OR SWEATING *NAUSEA AND VOMITING THAT IS NOT CONTROLLED WITH YOUR NAUSEA MEDICATION *UNUSUAL SHORTNESS OF BREATH *UNUSUAL BRUISING OR BLEEDING *URINARY PROBLEMS (pain or burning when urinating, or frequent urination) *BOWEL PROBLEMS (unusual diarrhea, constipation, pain near the anus) TENDERNESS IN MOUTH AND THROAT WITH OR WITHOUT PRESENCE OF ULCERS (sore throat, sores in mouth, or a toothache) UNUSUAL RASH, SWELLING OR PAIN  UNUSUAL VAGINAL DISCHARGE OR ITCHING   Items with * indicate a potential emergency and should be followed up as soon as possible or go  to the Emergency Department if any problems should occur.  Please show the CHEMOTHERAPY ALERT CARD or IMMUNOTHERAPY ALERT CARD at check-in to the Emergency Department and triage nurse.  Should you have questions after your visit or need to cancel or reschedule your appointment, please contact [facility] [phone]  and follow the prompts.  Office hours are 8:00 a.m. to 4:30 p.m. Monday - Friday. Please note that voicemails left after 4:00 p.m. may not be returned until the following business day.  We are closed weekends and major holidays. You have access to a nurse at all times for urgent questions. Please call the main number to the clinic [phone] and follow the prompts.  For any non-urgent questions, you may also contact your provider using MyChart. We now offer e-Visits for anyone 4 and older to request care online for non-urgent symptoms. For details visit [email or web address].   Also download the MyChart app! Go to the app store, search "MyChart", open the app, select [facility], and log in with your MyChart username and password.

## 2023-02-23 NOTE — ED Triage Notes (Addendum)
Pt presents with vomiting after receiving chemo treatment today for colon cancer. Pt started chemo in April and states that after his treatment today he was sick unlike he has been before after treatments. Denies any other symptoms other than emesis and nausea.

## 2023-02-24 DIAGNOSIS — R1032 Left lower quadrant pain: Secondary | ICD-10-CM | POA: Diagnosis not present

## 2023-02-24 DIAGNOSIS — Z85038 Personal history of other malignant neoplasm of large intestine: Secondary | ICD-10-CM | POA: Diagnosis not present

## 2023-02-24 DIAGNOSIS — R112 Nausea with vomiting, unspecified: Secondary | ICD-10-CM | POA: Diagnosis not present

## 2023-02-24 LAB — URINALYSIS, W/ REFLEX TO CULTURE (INFECTION SUSPECTED)
Bilirubin Urine: NEGATIVE
Glucose, UA: NEGATIVE mg/dL
Hgb urine dipstick: NEGATIVE
Ketones, ur: NEGATIVE mg/dL
Leukocytes,Ua: NEGATIVE
Nitrite: NEGATIVE
Protein, ur: NEGATIVE mg/dL
Specific Gravity, Urine: 1.039 — ABNORMAL HIGH (ref 1.005–1.030)
pH: 7 (ref 5.0–8.0)

## 2023-02-24 MED ORDER — DROPERIDOL 2.5 MG/ML IJ SOLN
2.5000 mg | Freq: Once | INTRAMUSCULAR | Status: AC
  Administered 2023-02-24: 2.5 mg via INTRAVENOUS
  Filled 2023-02-24: qty 2

## 2023-02-24 NOTE — ED Notes (Signed)
Pt tolerating po fluids and ice chips.

## 2023-02-24 NOTE — ED Notes (Signed)
Discharge instructions provided by edp were discussed with pt. Was able to verbalize understanding with no additional questions at this time. Pt wheelchair to car

## 2023-02-24 NOTE — ED Provider Notes (Signed)
[place] EMERGENCY DEPARTMENT AT [facility] Provider Note   CSN: [id number] Arrival date & time: 02/23/23  2156     History  Chief Complaint  Patient presents with   Emesis    Zachary Nelson is a 47 y.o. male.  47 year old male presents ER today secondary to emesis.  Patient is on chemo for rectal cancer.  Has had chemo since May without significant issues but had chemo earlier today and shortly after started get nauseous.  Has been vomiting since then.  Nonbloody nonbilious.  No fevers.  Does have some mild abdominal crampiness and pain in his left side.  Maybe some chills but no measured fevers at home.   Emesis      Home Medications Prior to Admission medications   Medication Sig Start Date End Date Taking? Authorizing Provider  dextrose 5 % SOLN 1,000 mL with fluorouracil 5 GM/100ML SOLN Inject into the vein over 48 hr. Every 14 days 11/29/22   [provider]  escitalopram (LEXAPRO) 10 MG tablet Take 1 tablet (10 mg total) by mouth daily. 02/07/23   Doreatha Massed, MD  FLUOROURACIL IV Inject into the vein every 14 (fourteen) days. 11/29/22   [provider]  HYDROcodone-acetaminophen (NORCO/VICODIN) 5-325 MG tablet Take 2 tablets by mouth every 6 (six) hours as needed for severe pain. 01/01/23   Gerhard Munch, MD  LEUCOVORIN CALCIUM IV Inject into the vein every 14 (fourteen) days. 11/29/22   [provider]  lidocaine (XYLOCAINE) 2 % solution Use as directed 15 mLs in the mouth or throat as needed for mouth pain. 12/26/22   Doreatha Massed, MD  Lidocaine 3 % CREA Apply 1 Application topically 3 (three) times daily as needed. 01/01/23   Gerhard Munch, MD  lidocaine-prilocaine (EMLA) cream Apply a quarter-sized amount to port a cath site and cover with plastic wrap 1 hour prior to infusion appointments 11/24/22   Doreatha Massed, MD  OXALIPLATIN IV Inject into the vein every 14 (fourteen) days. 11/29/22   [provider]  prochlorperazine (COMPAZINE) 10 MG tablet Take 1 tablet (10 mg total) by mouth every 6 (six) hours as needed for nausea or vomiting. 11/24/22   Doreatha Massed, MD  promethazine (PHENERGAN) 25 MG suppository Place 1 suppository (25 mg total) rectally every 6 (six) hours as needed for nausea or vomiting. 02/09/23   Doreatha Massed, MD      Allergies    Bee venom    Review of Systems   Review of Systems  Gastrointestinal:  Positive for vomiting.    Physical Exam Updated Vital Signs BP (!) 132/91   Pulse 96   Temp 98.6 F (37 C)   Resp 17   SpO2 95%  Physical Exam Vitals and nursing note reviewed.  Constitutional:      Appearance: He is well-developed.  HENT:     Head: Normocephalic and atraumatic.  Eyes:     Pupils: Pupils are equal, round, and reactive to light.  Cardiovascular:     Rate and Rhythm: Normal rate.  Pulmonary:     Effort: Pulmonary effort is normal. No respiratory distress.  Abdominal:     General: There is no distension.  Musculoskeletal:        General: Normal range of motion.     Cervical back: Normal range of motion.  Skin:    General: Skin is warm and dry.     Coloration: Skin is not jaundiced or pale.  Neurological:     General:  No focal deficit present.     Mental Status: He is alert.     ED Results / Procedures / Treatments   Labs (all labs ordered are listed, but only abnormal results are displayed) Labs Reviewed  CBC WITH DIFFERENTIAL/PLATELET - Abnormal; Notable for the following components:      Result Value   WBC 57.5 (*)    RDW 18.1 (*)    Platelets 132 (*)    Neutro Abs 54.6 (*)    Abs Immature Granulocytes 1.20 (*)    All other components within normal limits  COMPREHENSIVE METABOLIC PANEL - Abnormal; Notable for the following components:   CO2 20 (*)    BUN 23 (*)    Creatinine, Ser 1.28 (*)    Total Protein 8.3 (*)    AST 63 (*)    ALT 71 (*)    Alkaline Phosphatase 159 (*)    Total Bilirubin 1.3  (*)    All other components within normal limits  URINALYSIS, W/ REFLEX TO CULTURE (INFECTION SUSPECTED) - Abnormal; Notable for the following components:   Specific Gravity, Urine 1.039 (*)    Bacteria, UA RARE (*)    All other components within normal limits  LIPASE, BLOOD    EKG None  Radiology CT ABDOMEN PELVIS W CONTRAST  Result Date: 02/24/2023 CLINICAL DATA:  Left lower quadrant pain history of colon carcinoma and recent chemotherapy treatment EXAM: CT ABDOMEN AND PELVIS WITH CONTRAST TECHNIQUE: Multidetector CT imaging of the abdomen and pelvis was performed using the standard protocol following bolus administration of intravenous contrast. RADIATION DOSE REDUCTION: This exam was performed according to the departmental dose-optimization program which includes automated exposure control, adjustment of the mA and/or kV according to patient size and/or use of iterative reconstruction technique. CONTRAST:  OMNIPAQUE IOHEXOL 300 MG/ML  SOLN COMPARISON:  None Available. FINDINGS: Lower chest: No acute abnormality. Hepatobiliary: No focal liver abnormality is seen. No gallstones, gallbladder wall thickening, or biliary dilatation. Pancreas: Unremarkable. No pancreatic ductal dilatation or surrounding inflammatory changes. Spleen: Normal in size without focal abnormality. Adrenals/Urinary Tract: Adrenal glands are within normal limits. Kidneys demonstrate a normal enhancement pattern bilaterally. No calculi or obstructive changes noted. The bladder is decompressed. Stomach/Bowel: The appendix is within normal limits. No obstructive or inflammatory changes of the colon are noted. Small bowel and stomach are within normal limits. Known low rectal neoplasm is not well appreciated on this exam. Vascular/Lymphatic: No significant vascular findings are present. No enlarged abdominal or pelvic lymph nodes. Reproductive: Prostate is unremarkable. Other: No abdominal wall hernia or abnormality. No  abdominopelvic ascites. Musculoskeletal: No acute or significant osseous findings. IMPRESSION: No acute abnormality noted. Electronically Signed   By: Alcide Clever M.D.   On: 02/24/2023 00:30    Procedures Procedures    Medications Ordered in ED Medications  sodium chloride 0.9 % bolus 1,000 mL (0 mLs Intravenous Stopped 02/24/23 0039)  metoCLOPramide (REGLAN) injection 10 mg (10 mg Intravenous Given 02/23/23 2329)  fentaNYL (SUBLIMAZE) injection 50 mcg (50 mcg Intravenous Given 02/23/23 2334)  iohexol (OMNIPAQUE) 300 MG/ML solution 100 mL (100 mLs Intravenous Contrast Given 02/23/23 2353)  droperidol (INAPSINE) 2.5 MG/ML injection 2.5 mg (2.5 mg Intravenous Given 02/24/23 0107)    ED Course/ Medical Decision Making/ A&P                                 Medical Decision Making Amount and/or Complexity of Data  Reviewed Radiology: ordered. ECG/medicine tests: ordered.  Risk Prescription drug management.  Likely side effect from the chemo.  He did get leucovorin today I suspect that is why his white count still high but with the asymmetric abdominal pain that is crampy and sharp likely related to vomiting but will check for diverticulitis or any other pathology.  Otherwise symptomatic treatment. Patient tolerating p.o.  Feels better.  CT scan without any evidence of diverticulitis.  I suspect his symptoms are probably more related to throwing up than infection.  Of course has a leukocytosis.  Was little bit tachycardic likely related to dehydration however patient is tolerating p.o. making urine and prefers to go home and rehydrate at home.  He has oral and PR antiemetics at home.  I think this is appropriate.  Will discuss with his oncologist about the emesis and the need for the ED visit to see if they want a modify his chemo or other medications. Return precautions provided.    Final Clinical Impression(s) / ED Diagnoses Final diagnoses:  Nausea and vomiting, unspecified vomiting type   Dehydration    Rx / DC Orders ED Discharge Orders     None         Lenetta Piche, Barbara Cower, MD 02/24/23 [id number]

## 2023-03-06 NOTE — Progress Notes (Signed)
[facility] [address], [place] [address]    Clinic Day:  03/07/23     Referring physician: Doreatha Massed, MD  Patient Care Team: Patient, No Pcp Per as PCP - General (General Practice) Zachary Massed, MD as Medical Oncologist (Medical Oncology) Zachary Sarah, RN as Oncology Nurse Navigator (Medical Oncology)   ASSESSMENT & PLAN:   Assessment: 1.  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 - Colonoscopy and biopsy (10/13/2022): Adenocarcinoma.  MMR preserved. - Evaluated at [facility] cancer care. - Pelvic MRI (10/21/2022): A semi-annular ulcerated T3 N1 low rectal tumor with mucinous features sitting at or just above the level of the sphincter complex without demonstrable infiltration of the sphincter complex.  Lymph node measures 5 mm in short axis with minimum distance 5 mm from MRF at 9:00.  Several additional small rounded lymph nodes seen in the vicinity which fall below the threshold.  No demonstrable extra mesorectal pelvic metastatic disease. - Cycle 1 of FOLFOX started on 11/29/2022 - Germline mutation ([facility] genetics) [facility] 1 VUS   2.  Social/family history: - He is seen with her stepmother (Zachary Nelson) today.  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  Quit smoking 6 months ago.  Smoked half to 1 pack/day starting at age 28. - Sister had stomach cancer.  Another sister had colon polyps removed.  Maternal aunt had lung cancer.  Maternal great grandfather had stomach cancer.  Paternal grandfather had bone cancer.  Paternal aunt had skin cancer.  Another paternal aunt had breast cancer.  Paternal uncle had lung cancer.    Plan: 1.  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 - Labs today: Normal LFTs.  Creatinine normal.  CBC shows mild thrombocytopenia.  White count is normal. - Does not  report any peripheral neuropathy. - We we will proceed with cycle 8 of chemotherapy today. - He has MRI of the pelvis scheduled on 03/24/2023. - He will follow-up with radiation oncology. - We talked about initiating him on Xeloda 1500 mg twice daily Monday through Friday throughout the course of radiation. - We discussed side effects including rare chance of hand-foot skin reaction, diarrhea, fatigue, cytopenias among others. - We will try to enroll him in topical diclofenac protocol. - I will follow-up with him in 6 weeks.   2.  Throbbing rectal pain: - This has improved significantly.  He is not requiring tramadol on a regular basis.  3.  Depression: - Continue Lexapro 10 mg daily which is helping.    Orders Placed This Encounter  Procedures   CBC with Differential    Standing Status:   Standing    Number of Occurrences:   10    Standing Expiration Date:   03/06/2024   Comprehensive metabolic panel    Standing Status:   Standing    Number of Occurrences:   10    Standing Expiration Date:   03/06/2024   Magnesium    Standing Status:   Standing    Number of Occurrences:   10    Standing Expiration Date:   03/06/2024       Zachary Nelson,acting as a scribe for Zachary Massed, MD.,have documented all relevant documentation on the behalf of Zachary Massed, MD,as directed by  Zachary Massed, MD while in the presence of Zachary Massed, MD.  I,  Zachary Massed MD, have reviewed the above documentation for accuracy and completeness, and I agree with the above.       Zachary Massed, MD   8/27/20245:50 PM  CHIEF COMPLAINT:   Diagnosis: rectal adenocarcinoma    Cancer Staging  Rectal adenocarcinoma [facility]) Staging form: Colon and Rectum, AJCC 8th Edition - Clinical stage from 11/18/2022: Stage IIIB (cT3, cN1b, cM0) - Unsigned    Prior Therapy: none  Current Therapy:  neoadjuvant FOLFOX    HISTORY OF PRESENT ILLNESS:   Oncology History   Rectal adenocarcinoma (HCC)  11/18/2022 Initial Diagnosis   Rectal adenocarcinoma (HCC)   11/29/2022 -  Chemotherapy   Patient is on Treatment Plan : COLORECTAL FOLFOX q14d x 4 months     01/04/2023 Genetic Testing   Negative genetic testing on the CancerNext-Expanded+RNAinsight.  CDH1 p.V495A (c.1484T>C) VUS was found.  The report date is January 04, 2023.  The CancerNext-Expanded gene panel offered by [facility] and includes sequencing and rearrangement analysis for the following 77 genes: AIP, ALK, APC*, ATM*, AXIN2, BAP1, BARD1, BMPR1A, BRCA1*, BRCA2*, BRIP1*, CDC73, CDH1*, CDK4, CDKN1B, CDKN2A, CHEK2*, CTNNA1, DICER1, FH, FLCN, KIF1B, LZTR1, MAX, MEN1, MET, MLH1*, MSH2*, MSH3, MSH6*, MUTYH*, NF1*, NF2, NTHL1, PALB2*, PHOX2B, PMS2*, POT1, PRKAR1A, PTCH1, PTEN*, RAD51C*, RAD51D*, RB1, RET, SDHA, SDHAF2, SDHB, SDHC, SDHD, SMAD4, SMARCA4, SMARCB1, SMARCE1, STK11, SUFU, TMEM127, TP53*, TSC1, TSC2, and VHL (sequencing and deletion/duplication); EGFR, EGLN1, HOXB13, KIT, MITF, PDGFRA, POLD1, and POLE (sequencing only); EPCAM and GREM1 (deletion/duplication only). DNA and RNA analyses performed for * genes.       INTERVAL HISTORY:   Zachary Nelson is a 47 y.o. male presenting to clinic today for follow up of rectal adenocarcinoma. He was last seen by me on 02/21/23.  Since his last visit, he presented to the ED on 02/23/23 for nausea and vomiting. His WBC was severely elevated at 57.5, likely due to receiving leucovorin that day. His condition stabilized and he was discharged on 02/24/23.   Today, he states that he is doing well overall. His appetite level is at 100%. His energy level is at 70%.  He reports he has had a cold since 03/01/23. He notes congestion, rhinorrhea, cough, and decreased sense of smell that began today. He denies fever. He notes he feels better since cold began. He reports decreased energy levels after treatment that last 1 week before he his energy is back to normal.   He has an  appointment with Dr. Langston Masker tomorrow, 03/08/23.  PAST MEDICAL HISTORY:   Past Medical History: Past Medical History:  Diagnosis Date   Cancer (HCC)    Family history of breast cancer    Family history of stomach cancer     Surgical History: Past Surgical History:  Procedure Laterality Date   ANKLE FRACTURE SURGERY     FOOT SURGERY     IR IMAGING GUIDED PORT INSERTION  11/25/2022    Social History: Social History   Socioeconomic History   Marital status: Divorced    Spouse name: Not on file   Number of children: Not on file   Years of education: Not on file   Highest education level: Not on file  Occupational History   Not on file  Tobacco Use   Smoking status: Some Days    Types: Cigars    Last attempt to quit: 06/10/2022    Years since quitting: 0.7   Smokeless tobacco: Never  Vaping Use   Vaping status: Some Days  Substance and Sexual Activity  Alcohol use: Yes   Drug use: Not Currently   Sexual activity: Not on file  Other Topics Concern   Not on file  Social History Narrative   ** Merged History Encounter **       Social Determinants of Health   Financial Resource Strain: High Risk (11/28/2022)   Overall Financial Resource Strain (CARDIA)    Difficulty of Paying Living Expenses: Very hard  Food Insecurity: No Food Insecurity (11/22/2022)   Hunger Vital Sign    Worried About Running Out of Food in the Last Year: Never true    Ran Out of Food in the Last Year: Never true  Transportation Needs: No Transportation Needs (11/22/2022)   PRAPARE - Administrator, Civil Service (Medical): No    Lack of Transportation (Non-Medical): No  Physical Activity: Not on file  Stress: Not on file  Social Connections: Not on file  Intimate Partner Violence: Not At Risk (11/22/2022)   Humiliation, Afraid, Rape, and Kick questionnaire    Fear of Current or Ex-Partner: No    Emotionally Abused: No    Physically Abused: No    Sexually Abused: No    Family  History: Family History  Problem Relation Age of Onset   Stomach cancer Sister 78 - 41   Other Sister        ovary surgery   Cancer Maternal Aunt        NOS   Breast cancer Paternal Aunt        dx.>50   Melanoma Paternal Aunt 31 - 59   Aneurysm Paternal Grandmother    Bone cancer Paternal Grandfather        dx.>50   Cancer Cousin 65       pat first cousin with NOS cancer    Current Medications:  Current Outpatient Medications:    capecitabine (XELODA) 500 MG tablet, Take 3 tablets (1,500 mg total) by mouth 2 (two) times daily after a meal. Monday thru Friday, start with radiation. Take only on days of radiation, Disp: 180 tablet, Rfl: 0   dextrose 5 % SOLN 1,000 mL with fluorouracil 5 GM/100ML SOLN, Inject into the vein over 48 hr. Every 14 days, Disp: , Rfl:    escitalopram (LEXAPRO) 10 MG tablet, Take 1 tablet (10 mg total) by mouth daily., Disp: 30 tablet, Rfl: 2   FLUOROURACIL IV, Inject into the vein every 14 (fourteen) days., Disp: , Rfl:    HYDROcodone-acetaminophen (NORCO/VICODIN) 5-325 MG tablet, Take 2 tablets by mouth every 6 (six) hours as needed for severe pain., Disp: 12 tablet, Rfl: 0   LEUCOVORIN CALCIUM IV, Inject into the vein every 14 (fourteen) days., Disp: , Rfl:    lidocaine (XYLOCAINE) 2 % solution, Use as directed 15 mLs in the mouth or throat as needed for mouth pain., Disp: 10 mL, Rfl: 1   Lidocaine 3 % CREA, Apply 1 Application topically 3 (three) times daily as needed., Disp: 28.35 g, Rfl: 0   lidocaine-prilocaine (EMLA) cream, Apply a quarter-sized amount to port a cath site and cover with plastic wrap 1 hour prior to infusion appointments, Disp: 30 g, Rfl: 3   OXALIPLATIN IV, Inject into the vein every 14 (fourteen) days., Disp: , Rfl:    prochlorperazine (COMPAZINE) 10 MG tablet, Take 1 tablet (10 mg total) by mouth every 6 (six) hours as needed for nausea or vomiting., Disp: 60 tablet, Rfl: 4   promethazine (PHENERGAN) 25 MG suppository, Place 1  suppository (25 mg total)  rectally every 6 (six) hours as needed for nausea or vomiting., Disp: 12 each, Rfl: 1 No current facility-administered medications for this visit.  Facility-Administered Medications Ordered in Other Visits:    fluorouracil (ADRUCIL) 4,200 mg in sodium chloride 0.9 % 66 mL chemo infusion, 1,920 mg/m2 (Treatment Plan Recorded), Intravenous, 1 day or 1 dose, Zachary Massed, MD, Infusion Verify at 03/07/23 1512   Allergies: Allergies  Allergen Reactions   Bee Venom     REVIEW OF SYSTEMS:   Review of Systems  Constitutional:  Negative for chills, fatigue and fever.  HENT:   Negative for lump/mass, mouth sores, nosebleeds, sore throat and trouble swallowing.        +rhinorrhea +decreased sense of smell  Eyes:  Negative for eye problems.  Respiratory:  Positive for cough and shortness of breath.   Cardiovascular:  Negative for chest pain, leg swelling and palpitations.  Gastrointestinal:  Negative for abdominal pain, constipation, diarrhea, nausea and vomiting.  Genitourinary:  Negative for bladder incontinence, difficulty urinating, dysuria, frequency, hematuria and nocturia.   Musculoskeletal:  Negative for arthralgias, back pain, flank pain, myalgias and neck pain.  Skin:  Negative for itching and rash.  Neurological:  Positive for dizziness. Negative for headaches and numbness.  Hematological:  Does not bruise/bleed easily.  Psychiatric/Behavioral:  Positive for depression. Negative for sleep disturbance and suicidal ideas. The patient is nervous/anxious.   All other systems reviewed and are negative.    VITALS:   There were no vitals taken for this visit.  Wt Readings from Last 3 Encounters:  03/07/23 189 lb 3.2 oz (85.8 kg)  02/21/23 193 lb 12.8 oz (87.9 kg)  02/07/23 197 lb 3.2 oz (89.4 kg)    There is no height or weight on file to calculate BMI.  Performance status (ECOG): 1 - Symptomatic but completely ambulatory  PHYSICAL EXAM:    Physical Exam Vitals and nursing note reviewed. Exam conducted with a chaperone present.  Constitutional:      Appearance: Normal appearance.  Cardiovascular:     Rate and Rhythm: Normal rate and regular rhythm.     Pulses: Normal pulses.     Heart sounds: Normal heart sounds.  Pulmonary:     Effort: Pulmonary effort is normal.     Breath sounds: Normal breath sounds.  Abdominal:     Palpations: Abdomen is soft. There is no hepatomegaly, splenomegaly or mass.     Tenderness: There is no abdominal tenderness.  Musculoskeletal:     Right lower leg: No edema.     Left lower leg: No edema.  Lymphadenopathy:     Cervical: No cervical adenopathy.     Right cervical: No superficial, deep or posterior cervical adenopathy.    Left cervical: No superficial, deep or posterior cervical adenopathy.     Upper Body:     Right upper body: No supraclavicular or axillary adenopathy.     Left upper body: No supraclavicular or axillary adenopathy.  Neurological:     General: No focal deficit present.     Mental Status: He is alert and oriented to person, place, and time.  Psychiatric:        Mood and Affect: Mood normal.        Behavior: Behavior normal.     LABS:      Latest Ref Rng & Units 03/07/2023    9:51 AM 02/23/2023   10:47 PM 02/21/2023    9:23 AM  CBC  WBC 4.0 - 10.5 K/uL 9.8  57.5  14.6   Hemoglobin 13.0 - 17.0 g/dL 16.1  09.6  04.5   Hematocrit 39.0 - 52.0 % 34.4  40.7  36.8   Platelets 150 - 400 K/uL 123  132  118       Latest Ref Rng & Units 03/07/2023    9:51 AM 02/23/2023   10:47 PM 02/21/2023    9:23 AM  CMP  Glucose 70 - 99 mg/dL 97  89  89   BUN 6 - 20 mg/dL 12  23  13    Creatinine 0.61 - 1.24 mg/dL 4.09  8.11  9.14   Sodium 135 - 145 mmol/L 136  136  134   Potassium 3.5 - 5.1 mmol/L 3.6  4.2  3.8   Chloride 98 - 111 mmol/L 104  103  103   CO2 22 - 32 mmol/L 24  20  26    Calcium 8.9 - 10.3 mg/dL 8.8  9.2  9.1   Total Protein 6.5 - 8.1 g/dL 7.6  8.3  7.5    Total Bilirubin 0.3 - 1.2 mg/dL 0.5  1.3  0.4   Alkaline Phos 38 - 126 U/L 124  159  138   AST 15 - 41 U/L 35  63  47   ALT 0 - 44 U/L 31  71  51      Lab Results  Component Value Date   CEA1 3.1 11/29/2022   /  CEA  Date Value Ref Range Status  11/29/2022 3.1 0.0 - 4.7 ng/mL Final    Comment:    (NOTE)                             Nonsmokers          <3.9                             Smokers             <5.6 Roche [employer] Electrochemiluminescence Immunoassay (ECLIA) Values obtained with different assay methods or kits cannot be used interchangeably.  Results cannot be interpreted as absolute evidence of the presence or absence of malignant disease. Performed At: [facility] [address] [place], [place] [id number] Jolene Schimke MD YQ:[id number]    No results found for: "PSA1" No results found for: [phone]" No results found for: "CAN125"  No results found for: "TOTALPROTELP", "ALBUMINELP", "A1GS", "A2GS", "BETS", "BETA2SER", "GAMS", "MSPIKE", "SPEI" No results found for: "TIBC", "FERRITIN", "IRONPCTSAT" No results found for: "LDH"   STUDIES:   CT ABDOMEN PELVIS W CONTRAST  Result Date: 02/24/2023 CLINICAL DATA:  Left lower quadrant pain history of colon carcinoma and recent chemotherapy treatment EXAM: CT ABDOMEN AND PELVIS WITH CONTRAST TECHNIQUE: Multidetector CT imaging of the abdomen and pelvis was performed using the standard protocol following bolus administration of intravenous contrast. RADIATION DOSE REDUCTION: This exam was performed according to the departmental dose-optimization program which includes automated exposure control, adjustment of the mA and/or kV according to patient size and/or use of iterative reconstruction technique. CONTRAST:  OMNIPAQUE IOHEXOL 300 MG/ML  SOLN COMPARISON:  None Available. FINDINGS: Lower chest: No acute abnormality. Hepatobiliary: No focal liver abnormality is seen. No gallstones, gallbladder wall  thickening, or biliary dilatation. Pancreas: Unremarkable. No pancreatic ductal dilatation or surrounding inflammatory changes. Spleen: Normal in size without focal abnormality. Adrenals/Urinary Tract: Adrenal glands are within normal limits. Kidneys demonstrate a normal enhancement pattern bilaterally.  No calculi or obstructive changes noted. The bladder is decompressed. Stomach/Bowel: The appendix is within normal limits. No obstructive or inflammatory changes of the colon are noted. Small bowel and stomach are within normal limits. Known low rectal neoplasm is not well appreciated on this exam. Vascular/Lymphatic: No significant vascular findings are present. No enlarged abdominal or pelvic lymph nodes. Reproductive: Prostate is unremarkable. Other: No abdominal wall hernia or abnormality. No abdominopelvic ascites. Musculoskeletal: No acute or significant osseous findings. IMPRESSION: No acute abnormality noted. Electronically Signed   By: Alcide Clever M.D.   On: 02/24/2023 00:30

## 2023-03-07 ENCOUNTER — Inpatient Hospital Stay: Payer: MEDICAID

## 2023-03-07 ENCOUNTER — Other Ambulatory Visit: Payer: Self-pay

## 2023-03-07 ENCOUNTER — Other Ambulatory Visit (HOSPITAL_COMMUNITY): Payer: Self-pay

## 2023-03-07 ENCOUNTER — Telehealth: Payer: Self-pay

## 2023-03-07 ENCOUNTER — Encounter: Payer: Self-pay | Admitting: Hematology

## 2023-03-07 ENCOUNTER — Telehealth: Payer: Self-pay | Admitting: Pharmacist

## 2023-03-07 ENCOUNTER — Inpatient Hospital Stay (HOSPITAL_BASED_OUTPATIENT_CLINIC_OR_DEPARTMENT_OTHER): Payer: MEDICAID | Admitting: Hematology

## 2023-03-07 VITALS — BP 135/84 | HR 98 | Temp 96.2°F | Resp 20 | Wt 189.2 lb

## 2023-03-07 VITALS — BP 120/80 | HR 61 | Temp 96.7°F | Resp 18

## 2023-03-07 DIAGNOSIS — C20 Malignant neoplasm of rectum: Secondary | ICD-10-CM

## 2023-03-07 DIAGNOSIS — Z5111 Encounter for antineoplastic chemotherapy: Secondary | ICD-10-CM | POA: Diagnosis not present

## 2023-03-07 DIAGNOSIS — Z95828 Presence of other vascular implants and grafts: Secondary | ICD-10-CM

## 2023-03-07 LAB — CBC WITH DIFFERENTIAL/PLATELET
Abs Immature Granulocytes: 0.2 10*3/uL — ABNORMAL HIGH (ref 0.00–0.07)
Band Neutrophils: 5 %
Basophils Absolute: 0.2 10*3/uL — ABNORMAL HIGH (ref 0.0–0.1)
Basophils Relative: 2 %
Eosinophils Absolute: 0 10*3/uL (ref 0.0–0.5)
Eosinophils Relative: 0 %
HCT: 34.4 % — ABNORMAL LOW (ref 39.0–52.0)
Hemoglobin: 11.2 g/dL — ABNORMAL LOW (ref 13.0–17.0)
Lymphocytes Relative: 23 %
Lymphs Abs: 2.3 10*3/uL (ref 0.7–4.0)
MCH: 30.7 pg (ref 26.0–34.0)
MCHC: 32.6 g/dL (ref 30.0–36.0)
MCV: 94.2 fL (ref 80.0–100.0)
Metamyelocytes Relative: 1 %
Monocytes Absolute: 1.1 10*3/uL — ABNORMAL HIGH (ref 0.1–1.0)
Monocytes Relative: 11 %
Myelocytes: 1 %
Neutro Abs: 6.1 10*3/uL (ref 1.7–7.7)
Neutrophils Relative %: 57 %
Platelets: 123 10*3/uL — ABNORMAL LOW (ref 150–400)
RBC: 3.65 MIL/uL — ABNORMAL LOW (ref 4.22–5.81)
RDW: 17.6 % — ABNORMAL HIGH (ref 11.5–15.5)
WBC: 9.8 10*3/uL (ref 4.0–10.5)
nRBC: 0.2 % (ref 0.0–0.2)
nRBC: 2 /100 WBC — ABNORMAL HIGH

## 2023-03-07 LAB — COMPREHENSIVE METABOLIC PANEL
ALT: 31 U/L (ref 0–44)
AST: 35 U/L (ref 15–41)
Albumin: 3.7 g/dL (ref 3.5–5.0)
Alkaline Phosphatase: 124 U/L (ref 38–126)
Anion gap: 8 (ref 5–15)
BUN: 12 mg/dL (ref 6–20)
CO2: 24 mmol/L (ref 22–32)
Calcium: 8.8 mg/dL — ABNORMAL LOW (ref 8.9–10.3)
Chloride: 104 mmol/L (ref 98–111)
Creatinine, Ser: 1.05 mg/dL (ref 0.61–1.24)
GFR, Estimated: >60 mL/min (ref >60)
Glucose, Bld: 97 mg/dL (ref 70–99)
Potassium: 3.6 mmol/L (ref 3.5–5.1)
Sodium: 136 mmol/L (ref 135–145)
Total Bilirubin: 0.5 mg/dL (ref 0.3–1.2)
Total Protein: 7.6 g/dL (ref 6.5–8.1)

## 2023-03-07 LAB — MAGNESIUM: Magnesium: 2.1 mg/dL (ref 1.7–2.4)

## 2023-03-07 MED ORDER — SODIUM CHLORIDE 0.9 % IV SOLN
10.0000 mg | Freq: Once | INTRAVENOUS | Status: AC
  Administered 2023-03-07: 10 mg via INTRAVENOUS
  Filled 2023-03-07: qty 10

## 2023-03-07 MED ORDER — PALONOSETRON HCL INJECTION 0.25 MG/5ML
0.2500 mg | Freq: Once | INTRAVENOUS | Status: AC
  Administered 2023-03-07: 0.25 mg via INTRAVENOUS
  Filled 2023-03-07: qty 5

## 2023-03-07 MED ORDER — OXALIPLATIN CHEMO INJECTION 100 MG/20ML
68.0000 mg/m2 | Freq: Once | INTRAVENOUS | Status: AC
  Administered 2023-03-07: 150 mg via INTRAVENOUS
  Filled 2023-03-07: qty 20

## 2023-03-07 MED ORDER — SODIUM CHLORIDE 0.9 % IV SOLN
1920.0000 mg/m2 | INTRAVENOUS | Status: DC
  Administered 2023-03-07: 4200 mg via INTRAVENOUS
  Filled 2023-03-07: qty 84

## 2023-03-07 MED ORDER — CAPECITABINE 500 MG PO TABS
1500.0000 mg | ORAL_TABLET | Freq: Two times a day (BID) | ORAL | 0 refills | Status: AC
Start: 2023-03-07 — End: ?
  Filled 2023-03-07 – 2023-03-09 (×2): qty 180, 30d supply, fill #0

## 2023-03-07 MED ORDER — FLUOROURACIL CHEMO INJECTION 2.5 GM/50ML
320.0000 mg/m2 | Freq: Once | INTRAVENOUS | Status: AC
  Administered 2023-03-07: 700 mg via INTRAVENOUS
  Filled 2023-03-07: qty 14

## 2023-03-07 MED ORDER — LEUCOVORIN CALCIUM INJECTION 350 MG
320.0000 mg/m2 | Freq: Once | INTRAVENOUS | Status: AC
  Administered 2023-03-07: 698 mg via INTRAVENOUS
  Filled 2023-03-07: qty 34.9

## 2023-03-07 MED ORDER — SODIUM CHLORIDE 0.9 % IV SOLN
150.0000 mg | Freq: Once | INTRAVENOUS | Status: AC
  Administered 2023-03-07: 150 mg via INTRAVENOUS
  Filled 2023-03-07: qty 150

## 2023-03-07 MED ORDER — DEXTROSE 5 % IV SOLN: Freq: Once | INTRAVENOUS | Status: AC

## 2023-03-07 MED ORDER — SODIUM CHLORIDE 0.9% FLUSH
10.0000 mL | INTRAVENOUS | Status: DC | PRN
  Administered 2023-03-07: 10 mL via INTRAVENOUS

## 2023-03-07 NOTE — Progress Notes (Signed)
Patients port flushed without difficulty.  Good blood return noted with no bruising or swelling noted at site.  Patient remains accessed for treatment.  

## 2023-03-07 NOTE — Telephone Encounter (Signed)
Clinical Pharmacist Practitioner Encounter   Received new prescription for Xeloda (capecitabine) for the treatment of stage III rectal cancer in conjunction with radiation, planned duration until the end of radiation.  CMP from 03/07/23 assessed, no relevant lab abnormalities. Prescription dose and frequency assessed.   Current medication list in Epic reviewed, one DDIs with capecitabine identified: Escitalopram: fluorouracil product my enhance the QTc prolongation effect of QTc prolonging antidepressants. The recommendation is to monitor for QTc prolongation. Of note patient was on IV fluorouracil during his in office regimen, so this is not a new DDI. Patient also had a recent ECG on 02/23/23 with an QTc of 414 msec.  Evaluated chart and no patient barriers to medication adherence identified.   Prescription has been e-scribed to the [facility] for benefits analysis and approval.  Oral Oncology Clinic will continue to follow for insurance authorization, copayment issues, initial counseling and start date.   Remi Haggard, PharmD, BCPS, BCOP, CPP Hematology/Oncology Clinical Pharmacist Practitioner [facility] [phone]  03/07/2023 11:42 AM

## 2023-03-07 NOTE — Telephone Encounter (Signed)
Oral Oncology Patient Advocate Encounter  After completing a benefits investigation, prior authorization for Capecitabine is not required at this time through [facility].  Patient's copay is $4.00.     Ardeen Fillers, CPhT Oncology Pharmacy Patient Advocate  [facility]  [phone] (phone) [phone] (fax) 03/07/2023 12:00 PM

## 2023-03-07 NOTE — Progress Notes (Signed)
Patient has been examined by Dr. Katragadda. Vital signs and labs have been reviewed by MD - ANC, Creatinine, LFTs, hemoglobin, and platelets are within treatment parameters per M.D. - pt may proceed with treatment.  Primary RN and pharmacy notified.  

## 2023-03-07 NOTE — Progress Notes (Signed)
Due to previous N/V occurrence adding emend 150 mg IV PB x 1  T.O. Dr Carilyn Goodpasture, PharmD

## 2023-03-07 NOTE — Patient Instructions (Signed)

## 2023-03-07 NOTE — Patient Instructions (Signed)
[facility]  Discharge Instructions: Thank you for choosing [facility] to provide your oncology and hematology care.  If you have a lab appointment with the Cancer Center - please note that after April 8th, 2024, all labs will be drawn in the cancer center.  You do not have to check in or register with the main entrance as you have in the past but will complete your check-in in the cancer center.  Wear comfortable clothing and clothing appropriate for easy access to any Portacath or PICC line.   We strive to give you quality time with your provider. You may need to reschedule your appointment if you arrive late (15 or more minutes).  Arriving late affects you and other patients whose appointments are after yours.  Also, if you miss three or more appointments without notifying the office, you may be dismissed from the clinic at the provider's discretion.      For prescription refill requests, have your pharmacy contact our office and allow 72 hours for refills to be completed.    Today you received the following chemotherapy and/or immunotherapy agents oxaliplatin, irinotecan, leucovorin, fluorouracil      To help prevent nausea and vomiting after your treatment, we encourage you to take your nausea medication as directed.  BELOW ARE SYMPTOMS THAT SHOULD BE REPORTED IMMEDIATELY: *FEVER GREATER THAN 100.4 F (38 C) OR HIGHER *CHILLS OR SWEATING *NAUSEA AND VOMITING THAT IS NOT CONTROLLED WITH YOUR NAUSEA MEDICATION *UNUSUAL SHORTNESS OF BREATH *UNUSUAL BRUISING OR BLEEDING *URINARY PROBLEMS (pain or burning when urinating, or frequent urination) *BOWEL PROBLEMS (unusual diarrhea, constipation, pain near the anus) TENDERNESS IN MOUTH AND THROAT WITH OR WITHOUT PRESENCE OF ULCERS (sore throat, sores in mouth, or a toothache) UNUSUAL RASH, SWELLING OR PAIN  UNUSUAL VAGINAL DISCHARGE OR ITCHING   Items with * indicate a potential emergency and should be  followed up as soon as possible or go to the Emergency Department if any problems should occur.  Please show the CHEMOTHERAPY ALERT CARD or IMMUNOTHERAPY ALERT CARD at check-in to the Emergency Department and triage nurse.  Should you have questions after your visit or need to cancel or reschedule your appointment, please contact [facility] [phone]  and follow the prompts.  Office hours are 8:00 a.m. to 4:30 p.m. Monday - Friday. Please note that voicemails left after 4:00 p.m. may not be returned until the following business day.  We are closed weekends and major holidays. You have access to a nurse at all times for urgent questions. Please call the main number to the clinic [phone] and follow the prompts.  For any non-urgent questions, you may also contact your provider using MyChart. We now offer e-Visits for anyone 84 and older to request care online for non-urgent symptoms. For details visit [email or web address].   Also download the MyChart app! Go to the app store, search "MyChart", open the app, select , and log in with your MyChart username and password.

## 2023-03-07 NOTE — Progress Notes (Signed)
Labs reviewed with MD today at office visit. Will proceed with treatment per MD. Last treatment today per MD.   Treatment given per orders. Patient tolerated it well without problems. Vitals stable and discharged home from clinic ambulatory. Follow up as scheduled.

## 2023-03-08 ENCOUNTER — Other Ambulatory Visit: Payer: Self-pay

## 2023-03-08 ENCOUNTER — Other Ambulatory Visit (HOSPITAL_COMMUNITY): Payer: Self-pay

## 2023-03-09 ENCOUNTER — Other Ambulatory Visit: Payer: Self-pay

## 2023-03-09 ENCOUNTER — Inpatient Hospital Stay: Payer: MEDICAID

## 2023-03-09 ENCOUNTER — Inpatient Hospital Stay: Payer: MEDICAID | Admitting: Dietician

## 2023-03-09 ENCOUNTER — Encounter: Payer: Self-pay | Admitting: Hematology

## 2023-03-09 ENCOUNTER — Other Ambulatory Visit (HOSPITAL_COMMUNITY): Payer: Self-pay

## 2023-03-09 VITALS — BP 118/59 | HR 60 | Temp 98.2°F | Resp 18

## 2023-03-09 DIAGNOSIS — C20 Malignant neoplasm of rectum: Secondary | ICD-10-CM

## 2023-03-09 DIAGNOSIS — Z5111 Encounter for antineoplastic chemotherapy: Secondary | ICD-10-CM | POA: Diagnosis not present

## 2023-03-09 MED ORDER — HEPARIN SOD (PORK) LOCK FLUSH 100 UNIT/ML IV SOLN
500.0000 [IU] | Freq: Once | INTRAVENOUS | Status: AC | PRN
  Administered 2023-03-09: 500 [IU]

## 2023-03-09 MED ORDER — SODIUM CHLORIDE 0.9% FLUSH
10.0000 mL | INTRAVENOUS | Status: DC | PRN
  Administered 2023-03-09: 10 mL

## 2023-03-09 MED ORDER — PEGFILGRASTIM-JMDB 6 MG/0.6ML ~~LOC~~ SOSY
6.0000 mg | PREFILLED_SYRINGE | Freq: Once | SUBCUTANEOUS | Status: AC
  Administered 2023-03-09: 6 mg via SUBCUTANEOUS
  Filled 2023-03-09: qty 0.6

## 2023-03-09 NOTE — Telephone Encounter (Signed)
Patient successfully OnBoarded and drug education provided by pharmacist. Medication scheduled to be shipped on 03/14/23 for delivery on 03/15/23 from [facility] to patient's address. Patient also knows to call me at [phone] with any questions or concerns regarding receiving medication or if there is any unexpected change in co-pay.    Ardeen Fillers, CPhT Oncology Pharmacy Patient Advocate  [facility]  [phone] (phone) [phone] (fax) 03/09/2023 1:50 PM

## 2023-03-09 NOTE — Telephone Encounter (Signed)
Clinical Pharmacist Practitioner Encounter   [facility] (Specialty) will deliver medication to patient on 03/15/23. Patient knows not to start until his first day of radiation, currently scheduled for 03/27/23.   Patient Education I spoke with patient for overview of new oral chemotherapy medication: Xeloda (capecitabine) for the treatment of stage III rectal cancer in conjunction with radiation, planned duration until the end of radiation.   Counseled patient on administration, dosing, side effects, monitoring, drug-food interactions, safe handling, storage, and disposal. Patient will take 3 tablets (1,500 mg total) by mouth 2 (two) times daily after a meal. Monday thru Friday, start with radiation. Take only on days of radiation.  Side effects include but not limited to: diarrhea, hand-foot syndrome, mouth sores, edema, decreased wbc, fatigue, N/V Diarrhea: patient knows to use loperamide as needed and call the office if he is having 4 or more loose stools per day Hand-foot syndrome: recommended the use of Udderly Smooth Extra Care 20 Mouth sores: patient has use magic mouthwash in the past, and knows it can be used again if mouth sores occur    Reviewed with patient importance of keeping a medication schedule and plan for any missed doses.  After discussion with patient no patient barriers to medication adherence identified.   Zachary Nelson voiced understanding and appreciation. All questions answered. Medication handout provided.  Provided patient with [facility] phone number. Patient knows to call the office with questions or concerns. Oral Chemotherapy Navigation Clinic will continue to follow.  Zachary Nelson, PharmD, BCPS, BCOP, CPP Hematology/Oncology Clinical Pharmacist Practitioner [place] [phone]  03/09/2023 1:43 PM

## 2023-03-09 NOTE — Patient Instructions (Signed)
[facility]  Discharge Instructions: Thank you for choosing [facility] to provide your oncology and hematology care.  If you have a lab appointment with the Cancer Center - please note that after April 8th, 2024, all labs will be drawn in the cancer center.  You do not have to check in or register with the main entrance as you have in the past but will complete your check-in in the cancer center.  Wear comfortable clothing and clothing appropriate for easy access to any Portacath or PICC line.   We strive to give you quality time with your provider. You may need to reschedule your appointment if you arrive late (15 or more minutes).  Arriving late affects you and other patients whose appointments are after yours.  Also, if you miss three or more appointments without notifying the office, you may be dismissed from the clinic at the provider's discretion.      For prescription refill requests, have your pharmacy contact our office and allow 72 hours for refills to be completed.    Today you received Fulipha injection   BELOW ARE SYMPTOMS THAT SHOULD BE REPORTED IMMEDIATELY: *FEVER GREATER THAN 100.4 F (38 C) OR HIGHER *CHILLS OR SWEATING *NAUSEA AND VOMITING THAT IS NOT CONTROLLED WITH YOUR NAUSEA MEDICATION *UNUSUAL SHORTNESS OF BREATH *UNUSUAL BRUISING OR BLEEDING *URINARY PROBLEMS (pain or burning when urinating, or frequent urination) *BOWEL PROBLEMS (unusual diarrhea, constipation, pain near the anus) TENDERNESS IN MOUTH AND THROAT WITH OR WITHOUT PRESENCE OF ULCERS (sore throat, sores in mouth, or a toothache) UNUSUAL RASH, SWELLING OR PAIN  UNUSUAL VAGINAL DISCHARGE OR ITCHING   Items with * indicate a potential emergency and should be followed up as soon as possible or go to the Emergency Department if any problems should occur.  Please show the CHEMOTHERAPY ALERT CARD or IMMUNOTHERAPY ALERT CARD at check-in to the Emergency Department and triage  nurse.  Should you have questions after your visit or need to cancel or reschedule your appointment, please contact [facility] [phone]  and follow the prompts.  Office hours are 8:00 a.m. to 4:30 p.m. Monday - Friday. Please note that voicemails left after 4:00 p.m. may not be returned until the following business day.  We are closed weekends and major holidays. You have access to a nurse at all times for urgent questions. Please call the main number to the clinic [phone] and follow the prompts.  For any non-urgent questions, you may also contact your provider using MyChart. We now offer e-Visits for anyone 63 and older to request care online for non-urgent symptoms. For details visit [email or web address].   Also download the MyChart app! Go to the app store, search "MyChart", open the app, select [place], and log in with your MyChart username and password.

## 2023-03-09 NOTE — Progress Notes (Addendum)
Patient presents today for 5FU chemotherapy disconnection per provider's order and Fulphila 6 mg injection. Vital signs stable and pt voiced no new complaints at this time. Port flushed easily without diffculty with 10 mL of normal saline and 5 mL of heparin. Good blood return noted and removed intact. No bruising or swelling noted at the site.  Treatment given today per MD orders. Tolerated infusion without adverse affects. Vital signs stable. No complaints at this time. Discharged from clinic ambulatory in stable condition. Alert and oriented x 3. F/U with [facility] as scheduled.

## 2023-03-09 NOTE — Progress Notes (Signed)
Nutrition Follow-up:  Pt with stage III rectal cancer. He is receiving neoadjuvant Folfox q14 (first 5/21). Planning radiation under the care of Dr. Langston Masker concurrent with xeloda (tentative start 9/16)  Met with pt in clinic. He reports poor appetite. Recalls [employer] and fries yesterday. He may have snacked on chips. Some foods taste off. He does not tolerate milky supplements. Pt endorses frequent nausea. Only takes antiemetics when nausea is bad. He has mild cold sensitivity. Symptoms are worse after pump disconnect lasting ~3 days and then he begins feeling better. He has "cotton mouth" all the time. Pt does not like taste of water. He is drinking gatorade and hawaiian punch. He denies recent episodes of vomiting, diarrhea, constipation. Having daily BM.    Medications: reviewed   Labs: reviewed   Anthropometrics: Wt 189 lb 3.2 oz on 8/27 - trending down  8/13 - 193 lb 12.8 oz 7/30 - 197 lb 3.2 oz 6/23 - 202 lb 9.6 oz   2% in 2 weeks; 6% in 8 weeks - significant  NUTRITION DIAGNOSIS: Food and nutrition related knowledge deficit - ongoing    INTERVENTION:  Educated on strategies for nausea, foods best tolerated and foods to limit - handout provided Take antiemetics as prescribed, suggested waiting 30 minutes before eating Discussed small frequent meals snacks - handout with ideas given Educated on strategies for dry mouth, discussed limiting intake of sugary beverages, increasing fluids, suggested baking soda salt water rinses several times daily - handout with tips provided - samples of pedialyte hydration packets Provided warm supplement ideas - samples of Unjury  Ensure Clear samples provided    MONITORING, EVALUATION, GOAL: wt trends, intake   NEXT VISIT: Thursday September 19 via telephone

## 2023-03-10 ENCOUNTER — Other Ambulatory Visit (HOSPITAL_COMMUNITY): Payer: Self-pay

## 2023-03-10 NOTE — Telephone Encounter (Signed)
Patient offered referral to diclofenac prophylaxis hand-foot syndrome research project. Patient agreed to referral.

## 2023-03-14 ENCOUNTER — Other Ambulatory Visit (HOSPITAL_COMMUNITY): Payer: Self-pay

## 2023-03-14 ENCOUNTER — Other Ambulatory Visit: Payer: Self-pay

## 2023-03-14 DIAGNOSIS — C20 Malignant neoplasm of rectum: Secondary | ICD-10-CM

## 2023-03-14 MED ORDER — DICLOFENAC SODIUM 1 % EX GEL
CUTANEOUS | 1 refills | Status: AC
Start: 2023-03-14 — End: ?
  Filled 2023-03-14: qty 200, 30d supply, fill #0

## 2023-03-14 NOTE — Progress Notes (Signed)
Research Note - Consent Authorization   Study Name: Evaluation of Topical Diclofenac Use on the Incidence and Severity of Hand Foot Syndrome in Patients with Breast and Gastrointestinal Malignancies Taking Capecitabine   IRB# [id number]   A summary of the study was presented to the patient, including potential risks and benefits from study participation, alternatives treatment options available other than study participation, how their confidentiality will be maintained, and who to contact if they had questions regarding their rights as a study participant or if they experience an injury associated with their participation. They were told that participation is completely voluntary and choosing not to participate would not impact care they would otherwise have access to. They were also told that even if they choose to participate, they can withdraw their consent to participate at any time in the future. Any questions the patient asked were addressed by a member of the research team.   After having all of their questions answered, the patient agreed to participate in the study by confirming their willingness to consent to participation verbally over the phone on 03/14/2023 at 11:05 AM.    Jerry Caras, PharmD PGY2 Oncology Pharmacy Resident  Principal Investigator

## 2023-03-15 ENCOUNTER — Other Ambulatory Visit (HOSPITAL_COMMUNITY): Payer: Self-pay

## 2023-03-16 ENCOUNTER — Other Ambulatory Visit: Payer: Self-pay

## 2023-03-16 ENCOUNTER — Encounter: Payer: Self-pay | Admitting: Hematology

## 2023-03-16 ENCOUNTER — Other Ambulatory Visit (HOSPITAL_COMMUNITY): Payer: Self-pay

## 2023-03-17 ENCOUNTER — Encounter: Payer: Self-pay | Admitting: Hematology

## 2023-03-24 ENCOUNTER — Ambulatory Visit (HOSPITAL_COMMUNITY)
Admission: RE | Admit: 2023-03-24 | Discharge: 2023-03-24 | Disposition: A | Discharge status: Home / Self Care | Payer: MEDICAID | Source: Ambulatory Visit | Attending: Hematology | Admitting: Hematology

## 2023-03-24 DIAGNOSIS — C20 Malignant neoplasm of rectum: Secondary | ICD-10-CM | POA: Insufficient documentation

## 2023-03-28 ENCOUNTER — Other Ambulatory Visit: Payer: Self-pay

## 2023-03-29 ENCOUNTER — Other Ambulatory Visit: Payer: Self-pay

## 2023-03-30 ENCOUNTER — Telehealth: Payer: Self-pay | Admitting: Dietician

## 2023-03-30 ENCOUNTER — Inpatient Hospital Stay: Payer: MEDICAID | Attending: Hematology | Admitting: Dietician

## 2023-03-30 NOTE — Telephone Encounter (Signed)
Nutrition Follow-up:  Pt with stage III rectal cancer. He is receiving neoadjuvant Folfox q14 (first 5/21). Planning radiation under the care of Dr. Langston Masker concurrent with xeloda (planned start 9/30)   Spoke with pt via telephone. He reports feeling good and eating well. Pt recalls fruit, salads, chicken, sandwiches (turkey/ham). Altered taste has resolved. Pt denies nutrition impact symptoms at this time. Pt asking if he will have the same side effects experienced with Folfox while undergoing radiation concurrent with xeloda.    Medications: reviewed   Labs: no new labs for review  Anthropometrics: Wt 189 lb 3.2 oz on 8/27  8/13 - 193 lb 12.8 oz 7/30 - 197 lb 3.2 oz 6/23 - 202 lb 9.6 oz    NUTRITION DIAGNOSIS: Food and nutrition related knowledge deficit ongoing    INTERVENTION:  Encouraged including good sources of protein at every meal Encouraged activity as able Discussed common side effects of loose stools with xeloda + RT Pt has contact information    MONITORING, EVALUATION, GOAL: wt trends, intake   NEXT VISIT: Monday October 7 via telephone

## 2023-04-06 ENCOUNTER — Other Ambulatory Visit: Payer: Self-pay

## 2023-04-06 ENCOUNTER — Encounter: Payer: Self-pay | Admitting: Hematology

## 2023-04-10 ENCOUNTER — Encounter: Payer: Self-pay | Admitting: Hematology

## 2023-04-11 ENCOUNTER — Other Ambulatory Visit (HOSPITAL_COMMUNITY): Payer: Self-pay

## 2023-04-12 ENCOUNTER — Other Ambulatory Visit: Payer: Self-pay

## 2023-04-17 ENCOUNTER — Telehealth: Payer: Self-pay | Admitting: Dietician

## 2023-04-17 ENCOUNTER — Other Ambulatory Visit: Payer: Self-pay

## 2023-04-17 ENCOUNTER — Inpatient Hospital Stay: Payer: MEDICAID | Attending: Hematology | Admitting: Dietician

## 2023-04-17 DIAGNOSIS — C20 Malignant neoplasm of rectum: Secondary | ICD-10-CM | POA: Insufficient documentation

## 2023-04-17 DIAGNOSIS — Z1501 Genetic susceptibility to malignant neoplasm of breast: Secondary | ICD-10-CM | POA: Insufficient documentation

## 2023-04-17 DIAGNOSIS — Z1509 Genetic susceptibility to other malignant neoplasm: Secondary | ICD-10-CM | POA: Insufficient documentation

## 2023-04-17 DIAGNOSIS — G629 Polyneuropathy, unspecified: Secondary | ICD-10-CM | POA: Insufficient documentation

## 2023-04-17 DIAGNOSIS — F32A Depression, unspecified: Secondary | ICD-10-CM | POA: Insufficient documentation

## 2023-04-17 DIAGNOSIS — Z79899 Other long term (current) drug therapy: Secondary | ICD-10-CM | POA: Insufficient documentation

## 2023-04-17 DIAGNOSIS — Z452 Encounter for adjustment and management of vascular access device: Secondary | ICD-10-CM | POA: Insufficient documentation

## 2023-04-17 NOTE — Telephone Encounter (Signed)
Nutrition Follow-up:  Pt with stage III rectal cancer. He is receiving neoadjuvant Folfox q14 (first 5/21). He is currently receiving radiation under the care of Dr. Langston Masker concurrent with xeloda (start 10/1).  Spoke briefly with patient via telephone for nutrition follow-up. Patient reports he is napping at time of call. He endorses good appetite. Patient is drinking lots of water. States that he is tolerating concurrent treatment well. He denies nausea, vomiting, diarrhea, constipation.    Medications: reviewed   Labs: reviewed   Anthropometrics: No new wt for review. Last wt 189 lb 3.2 oz on 8/27   NUTRITION DIAGNOSIS: Food and nutrition related knowledge deficit improving    INTERVENTION:  Continue including good sources of protein at every meal Strive for wt maintenance     MONITORING, EVALUATION, GOAL: wt trends, intake   NEXT VISIT: Monday November 4 via telephone

## 2023-04-18 ENCOUNTER — Inpatient Hospital Stay (HOSPITAL_BASED_OUTPATIENT_CLINIC_OR_DEPARTMENT_OTHER): Payer: MEDICAID | Admitting: Hematology

## 2023-04-18 ENCOUNTER — Encounter: Payer: Self-pay | Admitting: Hematology

## 2023-04-18 ENCOUNTER — Inpatient Hospital Stay: Payer: MEDICAID

## 2023-04-18 DIAGNOSIS — C20 Malignant neoplasm of rectum: Secondary | ICD-10-CM

## 2023-04-18 DIAGNOSIS — Z452 Encounter for adjustment and management of vascular access device: Secondary | ICD-10-CM | POA: Diagnosis not present

## 2023-04-18 DIAGNOSIS — Z1501 Genetic susceptibility to malignant neoplasm of breast: Secondary | ICD-10-CM | POA: Diagnosis not present

## 2023-04-18 DIAGNOSIS — G629 Polyneuropathy, unspecified: Secondary | ICD-10-CM | POA: Diagnosis not present

## 2023-04-18 DIAGNOSIS — F32A Depression, unspecified: Secondary | ICD-10-CM | POA: Diagnosis not present

## 2023-04-18 DIAGNOSIS — Z1509 Genetic susceptibility to other malignant neoplasm: Secondary | ICD-10-CM | POA: Diagnosis not present

## 2023-04-18 DIAGNOSIS — Z79899 Other long term (current) drug therapy: Secondary | ICD-10-CM | POA: Diagnosis not present

## 2023-04-18 LAB — COMPREHENSIVE METABOLIC PANEL
ALT: 18 U/L (ref 0–44)
AST: 26 U/L (ref 15–41)
Albumin: 3.8 g/dL (ref 3.5–5.0)
Alkaline Phosphatase: 60 U/L (ref 38–126)
Anion gap: 9 (ref 5–15)
BUN: 11 mg/dL (ref 6–20)
CO2: 23 mmol/L (ref 22–32)
Calcium: 8.7 mg/dL — ABNORMAL LOW (ref 8.9–10.3)
Chloride: 105 mmol/L (ref 98–111)
Creatinine, Ser: 1.09 mg/dL (ref 0.61–1.24)
GFR, Estimated: >60 mL/min (ref >60)
Glucose, Bld: 96 mg/dL (ref 70–99)
Potassium: 3.5 mmol/L (ref 3.5–5.1)
Sodium: 137 mmol/L (ref 135–145)
Total Bilirubin: 0.8 mg/dL (ref 0.3–1.2)
Total Protein: 7.4 g/dL (ref 6.5–8.1)

## 2023-04-18 LAB — CBC WITH DIFFERENTIAL/PLATELET
Abs Immature Granulocytes: 0.02 10*3/uL (ref 0.00–0.07)
Basophils Absolute: 0 10*3/uL (ref 0.0–0.1)
Basophils Relative: 0 %
Eosinophils Absolute: 0 10*3/uL (ref 0.0–0.5)
Eosinophils Relative: 1 %
HCT: 36.1 % — ABNORMAL LOW (ref 39.0–52.0)
Hemoglobin: 11.6 g/dL — ABNORMAL LOW (ref 13.0–17.0)
Immature Granulocytes: 1 %
Lymphocytes Relative: 22 %
Lymphs Abs: 0.9 10*3/uL (ref 0.7–4.0)
MCH: 30.2 pg (ref 26.0–34.0)
MCHC: 32.1 g/dL (ref 30.0–36.0)
MCV: 94 fL (ref 80.0–100.0)
Monocytes Absolute: 0.4 10*3/uL (ref 0.1–1.0)
Monocytes Relative: 9 %
Neutro Abs: 2.7 10*3/uL (ref 1.7–7.7)
Neutrophils Relative %: 67 %
Platelets: 173 10*3/uL (ref 150–400)
RBC: 3.84 MIL/uL — ABNORMAL LOW (ref 4.22–5.81)
RDW: 14.4 % (ref 11.5–15.5)
WBC: 3.9 10*3/uL — ABNORMAL LOW (ref 4.0–10.5)
nRBC: 0 % (ref 0.0–0.2)

## 2023-04-18 LAB — MAGNESIUM: Magnesium: 1.8 mg/dL (ref 1.7–2.4)

## 2023-04-18 MED ORDER — HEPARIN SOD (PORK) LOCK FLUSH 100 UNIT/ML IV SOLN
500.0000 [IU] | Freq: Once | INTRAVENOUS | Status: AC
  Administered 2023-04-18: 500 [IU] via INTRAVENOUS

## 2023-04-18 MED ORDER — SODIUM CHLORIDE 0.9% FLUSH
10.0000 mL | INTRAVENOUS | Status: DC | PRN
  Administered 2023-04-18: 10 mL

## 2023-04-18 NOTE — Progress Notes (Signed)
Patient taking Xeloda as prescribed without any reported side effects.

## 2023-04-18 NOTE — Progress Notes (Signed)
[facility] [address] [place] [address]    Clinic Day:  04/18/23     Referring physician: Marlise Eves, MD  Patient Care Team: Patient, No Pcp Per as PCP - General (General Practice) Doreatha Massed, MD as Medical Oncologist (Medical Oncology) Therese Sarah, RN as Oncology Nurse Navigator (Medical Oncology)   ASSESSMENT & PLAN:   Assessment: 1.  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 - Colonoscopy and biopsy (10/13/2022): Adenocarcinoma.  MMR preserved. - Evaluated at [facility] care. - Pelvic MRI (10/21/2022): A semi-annular ulcerated T3 N1 low rectal tumor with mucinous features sitting at or just above the level of the sphincter complex without demonstrable infiltration of the sphincter complex.  Lymph node measures 5 mm in short axis with minimum distance 5 mm from MRF at 9:00.  Several additional small rounded lymph nodes seen in the vicinity which fall below the threshold.  No demonstrable extra mesorectal pelvic metastatic disease. - 8 cycles of FOLFOX from 11/29/2022 through 03/07/2023 - Germline mutation [facility]) [facility] 1 VUS - MRI pelvis (03/24/2023): Complete resolution of mid/distal rectal mass with no adenopathy. - Chemoradiation with Xeloda (1500 mg twice daily) started on 04/10/2023   2.  Social/family history: - [name]  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  Quit smoking 6 months ago.  Smoked half to 1 pack/day starting at age 42. - Sister had stomach cancer.  Another sister had colon polyps removed.  Maternal aunt had lung cancer.  Maternal great grandfather had stomach cancer.  Paternal grandfather had bone cancer.  Paternal aunt had skin cancer.  Another paternal aunt had breast cancer.  Paternal uncle had lung cancer.    Plan: 1.  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 - He denies any GI side effects. - He is using diclofenac gel twice daily.  Palms and soles are dry but no scaling.  No blistering. - MRI pelvis on 03/24/2023: Complete resolution of previously seen mid/distal rectal mass with no evidence of adenopathy or metastatic disease. - Reviewed labs from today: Normal LFTs.  White count is 3.9 with normal platelet count.  Electrolytes are normal. - Continue Xeloda 1500 mg twice daily Monday through Friday throughout the course of radiation. - RTC 1 week for follow-up with labs.   2.  Throbbing rectal pain: - This has resolved.  Not requiring tramadol.  3.  Depression: - He will continue Lexapro 10 mg daily which is helping.    Orders Placed This Encounter  Procedures  . CBC with Differential/Platelet    Standing Status:   Future    Standing Expiration Date:   04/17/2024    Order Specific Question:   Release to patient    Answer:   Immediate  . Comprehensive metabolic panel    Standing Status:   Future    Standing Expiration Date:   04/17/2024    Order Specific Question:   Release to patient    Answer:   Immediate  . Magnesium    Standing Status:   Future    Standing Expiration Date:   04/17/2024    Order Specific Question:   Release to patient    Answer:   Immediate       I,Helena R Teague,acting as a scribe for Doreatha Massed, MD.,have documented all  relevant documentation on the behalf of Doreatha Massed, MD,as directed by  Doreatha Massed, MD while in the presence of Doreatha Massed, MD.  I, Doreatha Massed MD, have reviewed the above documentation for accuracy and completeness, and I agree with the above.        Doreatha Massed, MD   10/8/20243:28 PM  CHIEF COMPLAINT:   Diagnosis: rectal adenocarcinoma    Cancer Staging  Rectal adenocarcinoma [facility]) Staging form: Colon and Rectum, AJCC 8th Edition - Clinical stage from 11/18/2022: Stage IIIB (cT3, cN1b, cM0) -  Unsigned    Prior Therapy: none  Current Therapy:  neoadjuvant FOLFOX    HISTORY OF PRESENT ILLNESS:   Oncology History  Rectal adenocarcinoma (HCC)  11/18/2022 Initial Diagnosis   Rectal adenocarcinoma (HCC)   11/29/2022 -  Chemotherapy   Patient is on Treatment Plan : COLORECTAL FOLFOX q14d x 4 months     01/04/2023 Genetic Testing   Negative genetic testing on the CancerNext-Expanded+RNAinsight.  CDH1 p.V495A (c.1484T>C) VUS was found.  The report date is January 04, 2023.  The CancerNext-Expanded gene panel offered by [facility] and includes sequencing and rearrangement analysis for the following 77 genes: AIP, ALK, APC*, ATM*, AXIN2, BAP1, BARD1, BMPR1A, BRCA1*, BRCA2*, BRIP1*, CDC73, CDH1*, CDK4, CDKN1B, CDKN2A, CHEK2*, CTNNA1, DICER1, FH, FLCN, KIF1B, LZTR1, MAX, MEN1, MET, MLH1*, MSH2*, MSH3, MSH6*, MUTYH*, NF1*, NF2, NTHL1, PALB2*, PHOX2B, PMS2*, POT1, PRKAR1A, PTCH1, PTEN*, RAD51C*, RAD51D*, RB1, RET, SDHA, SDHAF2, SDHB, SDHC, SDHD, SMAD4, SMARCA4, SMARCB1, SMARCE1, STK11, SUFU, TMEM127, TP53*, TSC1, TSC2, and VHL (sequencing and deletion/duplication); EGFR, EGLN1, HOXB13, KIT, MITF, PDGFRA, POLD1, and POLE (sequencing only); EPCAM and GREM1 (deletion/duplication only). DNA and RNA analyses performed for * genes.       INTERVAL HISTORY:   Zachary Nelson is a 47 y.o. male presenting to clinic today for follow up of rectal adenocarcinoma. He was last seen by me on 03/07/23.  Since his last visit, he consented to participate on Evaluation of Topical Diclofenac Use on the Incidence and Severity of Hand Foot Syndrome in Patients with Breast and Gastrointestinal Malignancies Taking Capecitabine. He had an MRI of the pelvis on 03/24/23 that found: complete resolution of prior mid/distal rectal mass and no evidence of metastatic disease in the pelvis.  Today, he states that he is doing well overall. His appetite level is at 100%. His energy level is at 75%.  He started XRT on 04/10/23 and has  been taking Xeloda as prescribed. He denies any mouth soreness, nausea, vomiting, diarrhea, or constipation. He does report dryness on the palms and soles of the feet, as well as numbness on the fingertips and toes. He is taking Lexapro as prescribed.   PAST MEDICAL HISTORY:   Past Medical History: Past Medical History:  Diagnosis Date  . Cancer (HCC)   . Family history of breast cancer   . Family history of stomach cancer     Surgical History: Past Surgical History:  Procedure Laterality Date  . ANKLE FRACTURE SURGERY    . FOOT SURGERY    . IR IMAGING GUIDED PORT INSERTION  11/25/2022    Social History: Social History   Socioeconomic History  . Marital status: Divorced    Spouse name: Not on file  . Number of children: Not on file  . Years of education: Not on file  . Highest education level: Not on file  Occupational History  . Not on file  Tobacco Use  . Smoking status: Some Days    Types: Cigars  Last attempt to quit: 06/10/2022    Years since quitting: 0.8  . Smokeless tobacco: Never  Vaping Use  . Vaping status: Some Days  Substance and Sexual Activity  . Alcohol use: Yes  . Drug use: Not Currently  . Sexual activity: Not on file  Other Topics Concern  . Not on file  Social History Narrative   ** Merged History Encounter **       Social Determinants of Health   Financial Resource Strain: High Risk (11/28/2022)   Overall Financial Resource Strain (CARDIA)   . Difficulty of Paying Living Expenses: Very hard  Food Insecurity: No Food Insecurity (11/22/2022)   Hunger Vital Sign   . Worried About Programme researcher, broadcasting/film/video in the Last Year: Never true   . Ran Out of Food in the Last Year: Never true  Transportation Needs: No Transportation Needs (11/22/2022)   PRAPARE - Transportation   . Lack of Transportation (Medical): No   . Lack of Transportation (Non-Medical): No  Physical Activity: Not on file  Stress: Not on file  Social Connections: Not on file   Intimate Partner Violence: Not At Risk (11/22/2022)   Humiliation, Afraid, Rape, and Kick questionnaire   . Fear of Current or Ex-Partner: No   . Emotionally Abused: No   . Physically Abused: No   . Sexually Abused: No    Family History: Family History  Problem Relation Age of Onset  . Stomach cancer Sister 64 - 64  . Other Sister        ovary surgery  . Cancer Maternal Aunt        NOS  . Breast cancer Paternal Aunt        dx.>50  . Melanoma Paternal Aunt 78 - 50  . Aneurysm Paternal Grandmother   . Bone cancer Paternal Grandfather        dx.>50  . Cancer Cousin 48       pat first cousin with NOS cancer    Current Medications:  Current Outpatient Medications:  .  capecitabine (XELODA) 500 MG tablet, Take 3 tablets (1,500 mg total) by mouth 2 (two) times daily after a meal. Monday thru Friday, start with radiation. Take only on days of radiation, Disp: 180 tablet, Rfl: 0 .  dextrose 5 % SOLN 1,000 mL with fluorouracil 5 GM/100ML SOLN, Inject into the vein over 48 hr. Every 14 days, Disp: , Rfl:  .  diclofenac Sodium (VOLTAREN) 1 % GEL, Research Patient: Apply 0.5 g (1 fingertip) to each hand and each foot twice daily for the duration of radiation therapy, Disp: 200 g, Rfl: 1 .  escitalopram (LEXAPRO) 10 MG tablet, Take 1 tablet (10 mg total) by mouth daily., Disp: 30 tablet, Rfl: 2 .  FLUOROURACIL IV, Inject into the vein every 14 (fourteen) days., Disp: , Rfl:  .  HYDROcodone-acetaminophen (NORCO/VICODIN) 5-325 MG tablet, Take 2 tablets by mouth every 6 (six) hours as needed for severe pain., Disp: 12 tablet, Rfl: 0 .  LEUCOVORIN CALCIUM IV, Inject into the vein every 14 (fourteen) days., Disp: , Rfl:  .  lidocaine (XYLOCAINE) 2 % solution, Use as directed 15 mLs in the mouth or throat as needed for mouth pain., Disp: 10 mL, Rfl: 1 .  Lidocaine 3 % CREA, Apply 1 Application topically 3 (three) times daily as needed., Disp: 28.35 g, Rfl: 0 .  lidocaine-prilocaine (EMLA) cream,  Apply a quarter-sized amount to port a cath site and cover with plastic wrap 1 hour prior  to infusion appointments, Disp: 30 g, Rfl: 3 .  OXALIPLATIN IV, Inject into the vein every 14 (fourteen) days., Disp: , Rfl:  .  prochlorperazine (COMPAZINE) 10 MG tablet, Take 1 tablet (10 mg total) by mouth every 6 (six) hours as needed for nausea or vomiting., Disp: 60 tablet, Rfl: 4 .  promethazine (PHENERGAN) 25 MG suppository, Place 1 suppository (25 mg total) rectally every 6 (six) hours as needed for nausea or vomiting., Disp: 12 each, Rfl: 1 No current facility-administered medications for this visit.  Facility-Administered Medications Ordered in Other Visits:  .  sodium chloride flush (NS) 0.9 % injection 10 mL, 10 mL, Intracatheter, PRN, Doreatha Massed, MD, 10 mL at 04/18/23 1424   Allergies: Allergies  Allergen Reactions  . Bee Venom     REVIEW OF SYSTEMS:   Review of Systems  Constitutional:  Negative for chills, fatigue and fever.  HENT:   Negative for lump/mass, mouth sores, nosebleeds, sore throat and trouble swallowing.   Eyes:  Negative for eye problems.  Respiratory:  Negative for cough and shortness of breath.   Cardiovascular:  Negative for chest pain, leg swelling and palpitations.  Gastrointestinal:  Negative for abdominal pain, constipation, diarrhea, nausea and vomiting.  Genitourinary:  Negative for bladder incontinence, difficulty urinating, dysuria, frequency, hematuria and nocturia.   Musculoskeletal:  Negative for arthralgias, back pain, flank pain, myalgias and neck pain.  Skin:  Negative for itching and rash.  Neurological:  Positive for numbness (fingertips and toes). Negative for dizziness and headaches.  Hematological:  Does not bruise/bleed easily.  Psychiatric/Behavioral:  Negative for depression, sleep disturbance and suicidal ideas. The patient is not nervous/anxious.   All other systems reviewed and are negative.    VITALS:   There were no vitals  taken for this visit.  Wt Readings from Last 3 Encounters:  04/18/23 182 lb 6.4 oz (82.7 kg)  03/07/23 189 lb 3.2 oz (85.8 kg)  02/21/23 193 lb 12.8 oz (87.9 kg)    There is no height or weight on file to calculate BMI.  Performance status (ECOG): 1 - Symptomatic but completely ambulatory  PHYSICAL EXAM:   Physical Exam Vitals and nursing note reviewed. Exam conducted with a chaperone present.  Constitutional:      Appearance: Normal appearance.  Cardiovascular:     Rate and Rhythm: Normal rate and regular rhythm.     Pulses: Normal pulses.     Heart sounds: Normal heart sounds.  Pulmonary:     Effort: Pulmonary effort is normal.     Breath sounds: Normal breath sounds.  Abdominal:     Palpations: Abdomen is soft. There is no hepatomegaly, splenomegaly or mass.     Tenderness: There is no abdominal tenderness.  Musculoskeletal:     Right lower leg: No edema.     Left lower leg: No edema.  Lymphadenopathy:     Cervical: No cervical adenopathy.     Right cervical: No superficial, deep or posterior cervical adenopathy.    Left cervical: No superficial, deep or posterior cervical adenopathy.     Upper Body:     Right upper body: No supraclavicular or axillary adenopathy.     Left upper body: No supraclavicular or axillary adenopathy.  Neurological:     General: No focal deficit present.     Mental Status: He is alert and oriented to person, place, and time.  Psychiatric:        Mood and Affect: Mood normal.  Behavior: Behavior normal.    LABS:      Latest Ref Rng & Units 04/18/2023    2:18 PM 03/07/2023    9:51 AM 02/23/2023   10:47 PM  CBC  WBC 4.0 - 10.5 K/uL 3.9  9.8  57.5   Hemoglobin 13.0 - 17.0 g/dL 16.1  09.6  04.5   Hematocrit 39.0 - 52.0 % 36.1  34.4  40.7   Platelets 150 - 400 K/uL 173  123  132       Latest Ref Rng & Units 04/18/2023    2:18 PM 03/07/2023    9:51 AM 02/23/2023   10:47 PM  CMP  Glucose 70 - 99 mg/dL 96  97  89   BUN 6 - 20 mg/dL  11  12  23    Creatinine 0.61 - 1.24 mg/dL 4.09  8.11  9.14   Sodium 135 - 145 mmol/L 137  136  136   Potassium 3.5 - 5.1 mmol/L 3.5  3.6  4.2   Chloride 98 - 111 mmol/L 105  104  103   CO2 22 - 32 mmol/L 23  24  20    Calcium 8.9 - 10.3 mg/dL 8.7  8.8  9.2   Total Protein 6.5 - 8.1 g/dL 7.4  7.6  8.3   Total Bilirubin 0.3 - 1.2 mg/dL 0.8  0.5  1.3   Alkaline Phos 38 - 126 U/L 60  124  159   AST 15 - 41 U/L 26  35  63   ALT 0 - 44 U/L 18  31  71      Lab Results  Component Value Date   CEA1 3.1 11/29/2022   /  CEA  Date Value Ref Range Status  11/29/2022 3.1 0.0 - 4.7 ng/mL Final    Comment:    (NOTE)                             Nonsmokers          <3.9                             Smokers             <5.6 Roche [employer] Electrochemiluminescence Immunoassay (ECLIA) Values obtained with different assay methods or kits cannot be used interchangeably.  Results cannot be interpreted as absolute evidence of the presence or absence of malignant disease. Performed At: [facility] [address] [place], [place] [id number] Jolene Schimke MD YQ:[id number]    No results found for: "PSA1" No results found for: [phone]" No results found for: "CAN125"  No results found for: "TOTALPROTELP", "ALBUMINELP", "A1GS", "A2GS", "BETS", "BETA2SER", "GAMS", "MSPIKE", "SPEI" No results found for: "TIBC", "FERRITIN", "IRONPCTSAT" No results found for: "LDH"   STUDIES:   MR PELVIS WO CM RECTAL CA STAGING  Result Date: 04/09/2023 CLINICAL DATA:  Follow-up rectal cancer EXAM: MRI PELVIS WITHOUT CONTRAST TECHNIQUE: Multiplanar multisequence MR imaging of the pelvis was performed. No intravenous contrast was administered. COMPARISON:  Outside hospital [facility]) MR pelvis dated 10/21/2022 (no report provided). FINDINGS: Urinary Tract:  Bladder is within normal limits. Bowel: Prior mid/distal rectal mass is no longer evident. Filling defect along the right lateral aspect of the  upper/mid rectum (series 6/image 8), favoring debris. Vascular/Lymphatic: No evidence of aneurysm. No suspicious pelvic lymphadenopathy. Reproductive:  Prostate is unremarkable. Other:  No pelvic ascites. Musculoskeletal: No focal  osseous lesions. IMPRESSION: Complete resolution of prior mid/distal rectal mass. No evidence of metastatic disease in the pelvis. Electronically Signed   By: Charline Bills M.D.   On: 04/09/2023 21:46

## 2023-04-18 NOTE — Progress Notes (Signed)
Zachary Nelson presented for Portacath access and flush with labs. Portacath located right chest wall accessed with  H 20 needle.  Good blood return present. Portacath flushed with 20ml NS and 500U/18ml Heparin and needle removed intact.  Procedure tolerated well and without incident.     Discharged from clinic ambulatory in stable condition. Alert and oriented x 3. F/U with [facility] as scheduled.

## 2023-04-18 NOTE — Patient Instructions (Addendum)
[facility]  Discharge Instructions  You were seen and examined today by Dr. Ellin Saba.  Dr. Ellin Saba discussed your most recent lab work which revealed that everything looks stable.  Follow-up as scheduled.    Thank you for choosing [facility] to provide your oncology and hematology care.   To afford each patient quality time with our provider, please arrive at least 15 minutes before your scheduled appointment time. You may need to reschedule your appointment if you arrive late (10 or more minutes). Arriving late affects you and other patients whose appointments are after yours.  Also, if you miss three or more appointments without notifying the office, you may be dismissed from the clinic at the provider's discretion.    Again, thank you for choosing [facility].  Our hope is that these requests will decrease the amount of time that you wait before being seen by our physicians.   If you have a lab appointment with the Cancer Center - please note that after April 8th, all labs will be drawn in the cancer center.  You do not have to check in or register with the main entrance as you have in the past but will complete your check-in at the cancer center.            _____________________________________________________________  Should you have questions after your visit to [facility], please contact our office at [phone] and follow the prompts.  Our office hours are 8:00 a.m. to 4:30 p.m. Monday - Thursday and 8:00 a.m. to 2:30 p.m. Friday.  Please note that voicemails left after 4:00 p.m. may not be returned until the following business day.  We are closed weekends and all major holidays.  You do have access to a nurse 24-7, just call the main number to the clinic [phone] and do not press any options, hold on the line and a nurse will answer the phone.    For prescription refill requests, have your  pharmacy contact our office and allow 72 hours.    Masks are no longer required in the cancer centers. If you would like for your care team to wear a mask while they are taking care of you, please let them know. You may have one support person who is at least 46 years old accompany you for your appointments.

## 2023-04-19 ENCOUNTER — Other Ambulatory Visit: Payer: Self-pay

## 2023-04-26 ENCOUNTER — Inpatient Hospital Stay: Payer: MEDICAID

## 2023-04-26 ENCOUNTER — Inpatient Hospital Stay: Payer: MEDICAID | Admitting: Hematology

## 2023-05-02 NOTE — Progress Notes (Signed)
[facility] [address] [place] [address]    Clinic Day:  05/03/2023  Referring physician: Marlise Eves, MD  Patient Care Team: Patient, No Pcp Per as PCP - General (General Practice) Doreatha Massed, MD as Medical Oncologist (Medical Oncology) Therese Sarah, RN as Oncology Nurse Navigator (Medical Oncology)   ASSESSMENT & PLAN:   Assessment: 1.  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 - Colonoscopy and biopsy (10/13/2022): Adenocarcinoma.  MMR preserved. - Evaluated at [facility] cancer care. - Pelvic MRI (10/21/2022): A semi-annular ulcerated T3 N1 low rectal tumor with mucinous features sitting at or just above the level of the sphincter complex without demonstrable infiltration of the sphincter complex.  Lymph node measures 5 mm in short axis with minimum distance 5 mm from MRF at 9:00.  Several additional small rounded lymph nodes seen in the vicinity which fall below the threshold.  No demonstrable extra mesorectal pelvic metastatic disease. - 8 cycles of FOLFOX from 11/29/2022 through 03/07/2023 - Germline mutation [facility]) [facility] 1 VUS - MRI pelvis (03/24/2023): Complete resolution of mid/distal rectal mass with no adenopathy. - Chemoradiation with Xeloda (1500 mg twice daily) started on 04/10/2023   2.  Social/family history: - [name]  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  Quit smoking 6 months ago.  Smoked half to 1 pack/day starting at age 88. - Sister had stomach cancer.  Another sister had colon polyps removed.  Maternal aunt had lung cancer.  Maternal great grandfather had stomach cancer.  Paternal grandfather had bone cancer.  Paternal aunt had skin cancer.  Another paternal aunt had breast cancer.  Paternal uncle had lung cancer.    Plan: 1.  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 - Chemoradiation therapy was started on 04/10/2023. - He is continuing to use diclofenac gel twice daily.  No mucositis or HFSR. - He has slight diarrhea up to 2 times per day.  He is not requiring Imodium. - Reviewed labs from today: Normal LFTs and creatinine.  CBC with mild leukopenia with normal ANC.  Electrolytes are normal. - Continue Xeloda 1500 mg twice daily Monday through Friday when receiving radiation.  RTC 2 weeks for follow-up. - He has a follow-up with Dr. Maisie Fus on 05/23/2023.   2.  Peripheral neuropathy: - He has constant numbness in the fingertips and toes.  No neuropathic pain.   3.  Depression: - Continue Lexapro 10 mg daily which is helping.    No orders of the defined types were placed in this encounter.     I,Katie Daubenspeck,acting as a Neurosurgeon for Doreatha Massed, MD.,have documented all relevant documentation on the behalf of Doreatha Massed, MD,as directed by  Doreatha Massed, MD while in the presence of Doreatha Massed, MD.   I, Doreatha Massed MD, have reviewed the above documentation for accuracy and completeness, and I agree with the above.   Doreatha Massed, MD   10/23/20245:16 PM  CHIEF COMPLAINT:   Diagnosis: rectal adenocarcinoma    Cancer Staging  Rectal adenocarcinoma [facility]) Staging form: Colon and Rectum, AJCC 8th Edition - Clinical stage from 11/18/2022: Stage IIIB (cT3, cN1b, cM0) - Unsigned    Prior Therapy: none  Current Therapy:  neoadjuvant FOLFOX  HISTORY OF PRESENT ILLNESS:   Oncology History  Rectal adenocarcinoma (HCC)  11/18/2022 Initial Diagnosis   Rectal adenocarcinoma (HCC)   11/29/2022 -  Chemotherapy   Patient is on Treatment Plan : COLORECTAL FOLFOX q14d x 4 months     01/04/2023 Genetic Testing   Negative genetic testing on the CancerNext-Expanded+RNAinsight.   CDH1 p.V495A (c.1484T>C) VUS was found.  The report date is January 04, 2023.  The CancerNext-Expanded gene panel offered by [facility] and includes sequencing and rearrangement analysis for the following 77 genes: AIP, ALK, APC*, ATM*, AXIN2, BAP1, BARD1, BMPR1A, BRCA1*, BRCA2*, BRIP1*, CDC73, CDH1*, CDK4, CDKN1B, CDKN2A, CHEK2*, CTNNA1, DICER1, FH, FLCN, KIF1B, LZTR1, MAX, MEN1, MET, MLH1*, MSH2*, MSH3, MSH6*, MUTYH*, NF1*, NF2, NTHL1, PALB2*, PHOX2B, PMS2*, POT1, PRKAR1A, PTCH1, PTEN*, RAD51C*, RAD51D*, RB1, RET, SDHA, SDHAF2, SDHB, SDHC, SDHD, SMAD4, SMARCA4, SMARCB1, SMARCE1, STK11, SUFU, TMEM127, TP53*, TSC1, TSC2, and VHL (sequencing and deletion/duplication); EGFR, EGLN1, HOXB13, KIT, MITF, PDGFRA, POLD1, and POLE (sequencing only); EPCAM and GREM1 (deletion/duplication only). DNA and RNA analyses performed for * genes.       INTERVAL HISTORY:   Zachary Nelson is a 47 y.o. male presenting to clinic today for follow up of rectal adenocarcinoma. He was last seen by me on 04/18/23.  Today, he states that he is doing well overall. His appetite level is at 60%. His energy level is at 60%.  PAST MEDICAL HISTORY:   Past Medical History: Past Medical History:  Diagnosis Date   Cancer (HCC)    Family history of breast cancer    Family history of stomach cancer     Surgical History: Past Surgical History:  Procedure Laterality Date   ANKLE FRACTURE SURGERY     FOOT SURGERY     IR IMAGING GUIDED PORT INSERTION  11/25/2022    Social History: Social History   Socioeconomic History   Marital status: Divorced    Spouse name: Not on file   Number of children: Not on file   Years of education: Not on file   Highest education level: Not on file  Occupational History   Not on file  Tobacco Use   Smoking status: Some Days    Types: Cigars    Last attempt to quit: 06/10/2022    Years since quitting: 0.8   Smokeless tobacco: Never  Vaping Use   Vaping status: Some Days  Substance and Sexual  Activity   Alcohol use: Yes   Drug use: Not Currently   Sexual activity: Not on file  Other Topics Concern   Not on file  Social History Narrative   ** Merged History Encounter **       Social Determinants of Health   Financial Resource Strain: High Risk (11/28/2022)   Overall Financial Resource Strain (CARDIA)    Difficulty of Paying Living Expenses: Very hard  Food Insecurity: No Food Insecurity (11/22/2022)   Hunger Vital Sign    Worried About Running Out of Food in the Last Year: Never true    Ran Out of Food in the Last Year: Never true  Transportation Needs: No Transportation Needs (11/22/2022)   PRAPARE - Administrator, Civil Service (Medical): No    Lack of Transportation (Non-Medical): No  Physical Activity: Not on file  Stress: Not on file  Social Connections: Not on file  Intimate Partner Violence: Not At Risk (11/22/2022)   Humiliation, Afraid, Rape, and Kick questionnaire    Fear of Current or Ex-Partner: No    Emotionally Abused:  No    Physically Abused: No    Sexually Abused: No    Family History: Family History  Problem Relation Age of Onset   Stomach cancer Sister 51 - 45   Other Sister        ovary surgery   Cancer Maternal Aunt        NOS   Breast cancer Paternal Aunt        dx.>50   Melanoma Paternal Aunt 23 - 59   Aneurysm Paternal Grandmother    Bone cancer Paternal Grandfather        dx.>50   Cancer Cousin 54       pat first cousin with NOS cancer    Current Medications:  Current Outpatient Medications:    capecitabine (XELODA) 500 MG tablet, Take 3 tablets (1,500 mg total) by mouth 2 (two) times daily after a meal. Monday thru Friday, start with radiation. Take only on days of radiation, Disp: 180 tablet, Rfl: 0   dextrose 5 % SOLN 1,000 mL with fluorouracil 5 GM/100ML SOLN, Inject into the vein over 48 hr. Every 14 days, Disp: , Rfl:    diclofenac Sodium (VOLTAREN) 1 % GEL, Research Patient: Apply 0.5 g (1 fingertip) to each  hand and each foot twice daily for the duration of radiation therapy, Disp: 200 g, Rfl: 1   escitalopram (LEXAPRO) 10 MG tablet, Take 1 tablet (10 mg total) by mouth daily., Disp: 30 tablet, Rfl: 2   FLUOROURACIL IV, Inject into the vein every 14 (fourteen) days., Disp: , Rfl:    HYDROcodone-acetaminophen (NORCO/VICODIN) 5-325 MG tablet, Take 2 tablets by mouth every 6 (six) hours as needed for severe pain., Disp: 12 tablet, Rfl: 0   LEUCOVORIN CALCIUM IV, Inject into the vein every 14 (fourteen) days., Disp: , Rfl:    lidocaine (XYLOCAINE) 2 % solution, Use as directed 15 mLs in the mouth or throat as needed for mouth pain., Disp: 10 mL, Rfl: 1   Lidocaine 3 % CREA, Apply 1 Application topically 3 (three) times daily as needed., Disp: 28.35 g, Rfl: 0   lidocaine-prilocaine (EMLA) cream, Apply a quarter-sized amount to port a cath site and cover with plastic wrap 1 hour prior to infusion appointments, Disp: 30 g, Rfl: 3   OXALIPLATIN IV, Inject into the vein every 14 (fourteen) days., Disp: , Rfl:    prochlorperazine (COMPAZINE) 10 MG tablet, Take 1 tablet (10 mg total) by mouth every 6 (six) hours as needed for nausea or vomiting., Disp: 60 tablet, Rfl: 4   promethazine (PHENERGAN) 25 MG suppository, Place 1 suppository (25 mg total) rectally every 6 (six) hours as needed for nausea or vomiting., Disp: 12 each, Rfl: 1   Allergies: Allergies  Allergen Reactions   Bee Venom     REVIEW OF SYSTEMS:   Review of Systems  Constitutional:  Negative for chills, fatigue and fever.  HENT:   Negative for lump/mass, mouth sores, nosebleeds, sore throat and trouble swallowing.   Eyes:  Negative for eye problems.  Respiratory:  Positive for shortness of breath. Negative for cough.   Cardiovascular:  Negative for chest pain, leg swelling and palpitations.  Gastrointestinal:  Positive for diarrhea. Negative for abdominal pain, constipation, nausea and vomiting.  Genitourinary:  Negative for bladder  incontinence, difficulty urinating, dysuria, frequency, hematuria and nocturia.   Musculoskeletal:  Negative for arthralgias, back pain, flank pain, myalgias and neck pain.  Skin:  Negative for itching and rash.  Neurological:  Positive for headaches. Negative  for dizziness and numbness.  Hematological:  Does not bruise/bleed easily.  Psychiatric/Behavioral:  Negative for depression, sleep disturbance and suicidal ideas. The patient is not nervous/anxious.   All other systems reviewed and are negative.    VITALS:   Blood pressure 113/84.  Wt Readings from Last 3 Encounters:  05/03/23 179 lb 1.6 oz (81.2 kg)  04/18/23 182 lb 6.4 oz (82.7 kg)  03/07/23 189 lb 3.2 oz (85.8 kg)    There is no height or weight on file to calculate BMI.  Performance status (ECOG): 1 - Symptomatic but completely ambulatory  PHYSICAL EXAM:   Physical Exam Vitals and nursing note reviewed. Exam conducted with a chaperone present.  Constitutional:      Appearance: Normal appearance.  Cardiovascular:     Rate and Rhythm: Normal rate and regular rhythm.     Pulses: Normal pulses.     Heart sounds: Normal heart sounds.  Pulmonary:     Effort: Pulmonary effort is normal.     Breath sounds: Normal breath sounds.  Abdominal:     Palpations: Abdomen is soft. There is no hepatomegaly, splenomegaly or mass.     Tenderness: There is no abdominal tenderness.  Musculoskeletal:     Right lower leg: No edema.     Left lower leg: No edema.  Lymphadenopathy:     Cervical: No cervical adenopathy.     Right cervical: No superficial, deep or posterior cervical adenopathy.    Left cervical: No superficial, deep or posterior cervical adenopathy.     Upper Body:     Right upper body: No supraclavicular or axillary adenopathy.     Left upper body: No supraclavicular or axillary adenopathy.  Neurological:     General: No focal deficit present.     Mental Status: He is alert and oriented to person, place, and time.   Psychiatric:        Mood and Affect: Mood normal.        Behavior: Behavior normal.     LABS:      Latest Ref Rng & Units 05/03/2023    9:23 AM 04/18/2023    2:18 PM 03/07/2023    9:51 AM  CBC  WBC 4.0 - 10.5 K/uL 3.4  3.9  9.8   Hemoglobin 13.0 - 17.0 g/dL 16.1  09.6  04.5   Hematocrit 39.0 - 52.0 % 39.6  36.1  34.4   Platelets 150 - 400 K/uL 155  173  123       Latest Ref Rng & Units 05/03/2023    9:23 AM 04/18/2023    2:18 PM 03/07/2023    9:51 AM  CMP  Glucose 70 - 99 mg/dL 409  96  97   BUN 6 - 20 mg/dL 9  11  12    Creatinine 0.61 - 1.24 mg/dL 8.11  9.14  7.82   Sodium 135 - 145 mmol/L 135  137  136   Potassium 3.5 - 5.1 mmol/L 3.4  3.5  3.6   Chloride 98 - 111 mmol/L 102  105  104   CO2 22 - 32 mmol/L 24  23  24    Calcium 8.9 - 10.3 mg/dL 8.9  8.7  8.8   Total Protein 6.5 - 8.1 g/dL 7.7  7.4  7.6   Total Bilirubin 0.3 - 1.2 mg/dL 0.7  0.8  0.5   Alkaline Phos 38 - 126 U/L 54  60  124   AST 15 - 41 U/L 25  26  35   ALT 0 - 44 U/L 17  18  31       Lab Results  Component Value Date   CEA1 3.1 11/29/2022   /  CEA  Date Value Ref Range Status  11/29/2022 3.1 0.0 - 4.7 ng/mL Final    Comment:    (NOTE)                             Nonsmokers          <3.9                             Smokers             <5.6 Roche [employer] Electrochemiluminescence Immunoassay (ECLIA) Values obtained with different assay methods or kits cannot be used interchangeably.  Results cannot be interpreted as absolute evidence of the presence or absence of malignant disease. Performed At: [facility] [address] [place], [place] [id number] Jolene Schimke MD WU:[id number]    No results found for: "PSA1" No results found for: "[id number]" No results found for: "CAN125"  No results found for: "TOTALPROTELP", "ALBUMINELP", "A1GS", "A2GS", "BETS", "BETA2SER", "GAMS", "MSPIKE", "SPEI" No results found for: "TIBC", "FERRITIN", "IRONPCTSAT" No results found for:  "LDH"   STUDIES:   No results found.

## 2023-05-03 ENCOUNTER — Inpatient Hospital Stay (HOSPITAL_BASED_OUTPATIENT_CLINIC_OR_DEPARTMENT_OTHER): Payer: MEDICAID | Admitting: Hematology

## 2023-05-03 ENCOUNTER — Inpatient Hospital Stay: Payer: MEDICAID

## 2023-05-03 VITALS — BP 113/84

## 2023-05-03 DIAGNOSIS — C20 Malignant neoplasm of rectum: Secondary | ICD-10-CM | POA: Diagnosis not present

## 2023-05-03 LAB — CBC WITH DIFFERENTIAL/PLATELET
Abs Immature Granulocytes: 0.01 10*3/uL (ref 0.00–0.07)
Basophils Absolute: 0 10*3/uL (ref 0.0–0.1)
Basophils Relative: 0 %
Eosinophils Absolute: 0.1 10*3/uL (ref 0.0–0.5)
Eosinophils Relative: 2 %
HCT: 39.6 % (ref 39.0–52.0)
Hemoglobin: 12.8 g/dL — ABNORMAL LOW (ref 13.0–17.0)
Immature Granulocytes: 0 %
Lymphocytes Relative: 13 %
Lymphs Abs: 0.5 10*3/uL — ABNORMAL LOW (ref 0.7–4.0)
MCH: 29.9 pg (ref 26.0–34.0)
MCHC: 32.3 g/dL (ref 30.0–36.0)
MCV: 92.5 fL (ref 80.0–100.0)
Monocytes Absolute: 0.3 10*3/uL (ref 0.1–1.0)
Monocytes Relative: 8 %
Neutro Abs: 2.6 10*3/uL (ref 1.7–7.7)
Neutrophils Relative %: 77 %
Platelets: 155 10*3/uL (ref 150–400)
RBC: 4.28 MIL/uL (ref 4.22–5.81)
RDW: 13.7 % (ref 11.5–15.5)
WBC: 3.4 10*3/uL — ABNORMAL LOW (ref 4.0–10.5)
nRBC: 0 % (ref 0.0–0.2)

## 2023-05-03 LAB — COMPREHENSIVE METABOLIC PANEL
ALT: 17 U/L (ref 0–44)
AST: 25 U/L (ref 15–41)
Albumin: 4.1 g/dL (ref 3.5–5.0)
Alkaline Phosphatase: 54 U/L (ref 38–126)
Anion gap: 9 (ref 5–15)
BUN: 9 mg/dL (ref 6–20)
CO2: 24 mmol/L (ref 22–32)
Calcium: 8.9 mg/dL (ref 8.9–10.3)
Chloride: 102 mmol/L (ref 98–111)
Creatinine, Ser: 1.13 mg/dL (ref 0.61–1.24)
GFR, Estimated: >60 mL/min (ref >60)
Glucose, Bld: 116 mg/dL — ABNORMAL HIGH (ref 70–99)
Potassium: 3.4 mmol/L — ABNORMAL LOW (ref 3.5–5.1)
Sodium: 135 mmol/L (ref 135–145)
Total Bilirubin: 0.7 mg/dL (ref 0.3–1.2)
Total Protein: 7.7 g/dL (ref 6.5–8.1)

## 2023-05-03 LAB — MAGNESIUM: Magnesium: 1.9 mg/dL (ref 1.7–2.4)

## 2023-05-03 MED ORDER — HEPARIN SOD (PORK) LOCK FLUSH 100 UNIT/ML IV SOLN
500.0000 [IU] | Freq: Once | INTRAVENOUS | Status: AC
  Administered 2023-05-03: 500 [IU] via INTRAVENOUS

## 2023-05-03 MED ORDER — SODIUM CHLORIDE 0.9% FLUSH
10.0000 mL | INTRAVENOUS | Status: DC | PRN
  Administered 2023-05-03: 10 mL via INTRAVENOUS

## 2023-05-03 NOTE — Progress Notes (Signed)
Patient is taking Xeloda as prescribed.  He has not missed any doses and reports no side effects at this time.   

## 2023-05-03 NOTE — Progress Notes (Signed)
Patients port flushed without difficulty.  Good blood return noted with no bruising or swelling noted at site.  Band aid applied.  VSS with discharge and left in satisfactory condition with no s/s of distress noted.   

## 2023-05-03 NOTE — Patient Instructions (Signed)
[facility] Discharge Instructions   You were seen and examined today by Dr. Ellin Saba.  He reviewed the results of your lab work which are normal/stable.   Continue Xeloda on the days of radiation as prescribed.   Return as scheduled.    Thank you for choosing  Cancer Center at [facility] to provide your oncology and hematology care.  To afford each patient quality time with our provider, please arrive at least 15 minutes before your scheduled appointment time.   If you have a lab appointment with the Cancer Center please come in thru the Main Entrance and check in at the main information desk.  You need to re-schedule your appointment should you arrive 10 or more minutes late.  We strive to give you quality time with our providers, and arriving late affects you and other patients whose appointments are after yours.  Also, if you no show three or more times for appointments you may be dismissed from the clinic at the providers discretion.     Again, thank you for choosing [facility].  Our hope is that these requests will decrease the amount of time that you wait before being seen by our physicians.       _____________________________________________________________  Should you have questions after your visit to [facility], please contact our office at [phone] and follow the prompts.  Our office hours are 8:00 a.m. and 4:30 p.m. Monday - Friday.  Please note that voicemails left after 4:00 p.m. may not be returned until the following business day.  We are closed weekends and major holidays.  You do have access to a nurse 24-7, just call the main number to the clinic [phone] and do not press any options, hold on the line and a nurse will answer the phone.    For prescription refill requests, have your pharmacy contact our office and allow 72 hours.    Due to Covid, you will need to wear a mask  upon entering the hospital. If you do not have a mask, a mask will be given to you at the Main Entrance upon arrival. For doctor visits, patients may have 1 support person age 59 or older with them. For treatment visits, patients can not have anyone with them due to social distancing guidelines and our immunocompromised population.

## 2023-05-03 NOTE — Patient Instructions (Signed)
[facility]  Discharge Instructions: Thank you for choosing [facility] to provide your oncology and hematology care.  If you have a lab appointment with the Cancer Center - please note that after April 8th, 2024, all labs will be drawn in the cancer center.  You do not have to check in or register with the main entrance as you have in the past but will complete your check-in in the cancer center.  Wear comfortable clothing and clothing appropriate for easy access to any Portacath or PICC line.   We strive to give you quality time with your provider. You may need to reschedule your appointment if you arrive late (15 or more minutes).  Arriving late affects you and other patients whose appointments are after yours.  Also, if you miss three or more appointments without notifying the office, you may be dismissed from the clinic at the provider's discretion.      For prescription refill requests, have your pharmacy contact our office and allow 72 hours for refills to be completed.    Today you received the following chemotherapy and/or immunotherapy agents port flush lab      To help prevent nausea and vomiting after your treatment, we encourage you to take your nausea medication as directed.  BELOW ARE SYMPTOMS THAT SHOULD BE REPORTED IMMEDIATELY: *FEVER GREATER THAN 100.4 F (38 C) OR HIGHER *CHILLS OR SWEATING *NAUSEA AND VOMITING THAT IS NOT CONTROLLED WITH YOUR NAUSEA MEDICATION *UNUSUAL SHORTNESS OF BREATH *UNUSUAL BRUISING OR BLEEDING *URINARY PROBLEMS (pain or burning when urinating, or frequent urination) *BOWEL PROBLEMS (unusual diarrhea, constipation, pain near the anus) TENDERNESS IN MOUTH AND THROAT WITH OR WITHOUT PRESENCE OF ULCERS (sore throat, sores in mouth, or a toothache) UNUSUAL RASH, SWELLING OR PAIN  UNUSUAL VAGINAL DISCHARGE OR ITCHING   Items with * indicate a potential emergency and should be followed up as soon as possible or go to  the Emergency Department if any problems should occur.  Please show the CHEMOTHERAPY ALERT CARD or IMMUNOTHERAPY ALERT CARD at check-in to the Emergency Department and triage nurse.  Should you have questions after your visit or need to cancel or reschedule your appointment, please contact [facility] AT [facility] [phone]  and follow the prompts.  Office hours are 8:00 a.m. to 4:30 p.m. Monday - Friday. Please note that voicemails left after 4:00 p.m. may not be returned until the following business day.  We are closed weekends and major holidays. You have access to a nurse at all times for urgent questions. Please call the main number to the clinic [phone] and follow the prompts.  For any non-urgent questions, you may also contact your provider using MyChart. We now offer e-Visits for anyone 47 and older to request care online for non-urgent symptoms. For details visit [email or web address].   Also download the MyChart app! Go to the app store, search "MyChart", open the app, select [place], and log in with your MyChart username and password.

## 2023-05-10 ENCOUNTER — Inpatient Hospital Stay: Payer: MEDICAID

## 2023-05-10 ENCOUNTER — Inpatient Hospital Stay: Payer: MEDICAID | Admitting: Hematology

## 2023-05-15 ENCOUNTER — Telehealth: Payer: Self-pay | Admitting: Dietician

## 2023-05-15 ENCOUNTER — Inpatient Hospital Stay: Payer: MEDICAID | Attending: Hematology | Admitting: Dietician

## 2023-05-15 NOTE — Telephone Encounter (Signed)
Nutrition Follow-up:  Pt with stage III rectal cancer. He is receiving neoadjuvant Folfox q14 (first 5/21). He is currently receiving radiation under the care of Dr. Langston Masker concurrent with xeloda (start 10/1).   Spoke with patient via telephone. He reports tolerating concurrent therapy well overall. He will complete radiation tomorrow (11/5). States that "his backside" is "a little messed up." He reports loose bowel movements. Denies diarrhea. Patient reports he is eating well and staying hydrated.    Medications: reviewed   Labs: no new labs   Anthropometrics: No new wts. Pt 179 lb 1.6 oz on 10/23   NUTRITION DIAGNOSIS: Food and nutrition related knowledge deficit improving    INTERVENTION:  Congratulated pt on final RT tomorrow Encouraged good sources of lean protein at every meal and high protein snacks in between to promote healing    MONITORING, EVALUATION, GOAL: wt trends, intake    NEXT VISIT: To be scheduled in collaboration with upcoming [facility] appointments as needed

## 2023-05-17 ENCOUNTER — Inpatient Hospital Stay: Payer: MEDICAID | Admitting: Hematology

## 2023-05-17 ENCOUNTER — Inpatient Hospital Stay: Payer: MEDICAID

## 2023-05-17 NOTE — Progress Notes (Incomplete)
[facility] [address] [place] [address]    Clinic Day:  05/17/2023  Referring physician: Marlise Eves, MD  Patient Care Team: Patient, No Pcp Per as PCP - General (General Practice) Doreatha Massed, MD as Medical Oncologist (Medical Oncology) Therese Sarah, RN as Oncology Nurse Navigator (Medical Oncology)   ASSESSMENT & PLAN:   Assessment: 1.  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 - Colonoscopy and biopsy (10/13/2022): Adenocarcinoma.  MMR preserved. - Evaluated at [facility] cancer care. - Pelvic MRI (10/21/2022): A semi-annular ulcerated T3 N1 low rectal tumor with mucinous features sitting at or just above the level of the sphincter complex without demonstrable infiltration of the sphincter complex.  Lymph node measures 5 mm in short axis with minimum distance 5 mm from MRF at 9:00.  Several additional small rounded lymph nodes seen in the vicinity which fall below the threshold.  No demonstrable extra mesorectal pelvic metastatic disease. - 8 cycles of FOLFOX from 11/29/2022 through 03/07/2023 - Germline mutation [facility]) [facility] 1 VUS - MRI pelvis (03/24/2023): Complete resolution of mid/distal rectal mass with no adenopathy. - Chemoradiation with Xeloda (1500 mg twice daily) started on 04/10/2023   2.  Social/family history: - [name]  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  Quit smoking 6 months ago.  Smoked half to 1 pack/day starting at age 81. - Sister had stomach cancer.  Another sister had colon polyps removed.  Maternal aunt had lung cancer.  Maternal great grandfather had stomach cancer.  Paternal grandfather had bone cancer.  Paternal aunt had skin cancer.  Another paternal aunt had breast cancer.  Paternal uncle had lung cancer.    Plan: 1.  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 - Chemoradiation therapy was started on 04/10/2023. - He is continuing to use diclofenac gel twice daily.  No mucositis or HFSR. - He has slight diarrhea up to 2 times per day.  He is not requiring Imodium. - Reviewed labs from today: Normal LFTs and creatinine.  CBC with mild leukopenia with normal ANC.  Electrolytes are normal. - Continue Xeloda 1500 mg twice daily Monday through Friday when receiving radiation.  RTC 2 weeks for follow-up. - He has a follow-up with Dr. Maisie Fus on 05/23/2023.   2.  Peripheral neuropathy: - He has constant numbness in the fingertips and toes.  No neuropathic pain.   3.  Depression: - Continue Lexapro 10 mg daily which is helping.    No orders of the defined types were placed in this encounter.     Alben Deeds Teague,acting as a Neurosurgeon for Doreatha Massed, MD.,have documented all relevant documentation on the behalf of Doreatha Massed, MD,as directed by  Doreatha Massed, MD while in the presence of Doreatha Massed, MD.  ***   [place] R Teague   11/6/202412:30 PM  CHIEF COMPLAINT:   Diagnosis: rectal adenocarcinoma    Cancer Staging  Rectal adenocarcinoma [facility]) Staging form: Colon and Rectum, AJCC 8th Edition - Clinical stage from 11/18/2022: Stage IIIB (cT3, cN1b, cM0) - Unsigned    Prior Therapy: none  Current Therapy:  neoadjuvant FOLFOX    HISTORY OF PRESENT ILLNESS:   Oncology History  Rectal adenocarcinoma (HCC)  11/18/2022 Initial Diagnosis  Rectal adenocarcinoma (HCC)   11/29/2022 -  Chemotherapy   Patient is on Treatment Plan : COLORECTAL FOLFOX q14d x 4 months     01/04/2023 Genetic Testing   Negative genetic testing on the CancerNext-Expanded+RNAinsight.  CDH1 p.V495A (c.1484T>C) VUS was found.  The report date is January 04, 2023.  The CancerNext-Expanded gene panel offered by [facility] and includes sequencing and rearrangement analysis for the following 77 genes: AIP, ALK, APC*, ATM*, AXIN2, BAP1, BARD1, BMPR1A, BRCA1*, BRCA2*, BRIP1*, CDC73, CDH1*, CDK4, CDKN1B, CDKN2A, CHEK2*, CTNNA1, DICER1, FH, FLCN, KIF1B, LZTR1, MAX, MEN1, MET, MLH1*, MSH2*, MSH3, MSH6*, MUTYH*, NF1*, NF2, NTHL1, PALB2*, PHOX2B, PMS2*, POT1, PRKAR1A, PTCH1, PTEN*, RAD51C*, RAD51D*, RB1, RET, SDHA, SDHAF2, SDHB, SDHC, SDHD, SMAD4, SMARCA4, SMARCB1, SMARCE1, STK11, SUFU, TMEM127, TP53*, TSC1, TSC2, and VHL (sequencing and deletion/duplication); EGFR, EGLN1, HOXB13, KIT, MITF, PDGFRA, POLD1, and POLE (sequencing only); EPCAM and GREM1 (deletion/duplication only). DNA and RNA analyses performed for * genes.       INTERVAL HISTORY:   Layman is a 47 y.o. male presenting to clinic today for follow up of rectal adenocarcinoma. He was last seen by me on 05/03/23.  Today, he states that he is doing well overall. His appetite level is at ***%. His energy level is at ***%.  PAST MEDICAL HISTORY:   Past Medical History: Past Medical History:  Diagnosis Date   Cancer (HCC)    Family history of breast cancer    Family history of stomach cancer     Surgical History: Past Surgical History:  Procedure Laterality Date   ANKLE FRACTURE SURGERY     FOOT SURGERY     IR IMAGING GUIDED PORT INSERTION  11/25/2022    Social History: Social History   Socioeconomic History   Marital status: Divorced    Spouse name: Not on file   Number of children: Not on file   Years of education: Not on file   Highest education level: Not on file  Occupational History   Not on file  Tobacco Use   Smoking status: Some Days    Types: Cigars    Last attempt to quit: 06/10/2022    Years since quitting: 0.9   Smokeless tobacco: Never  Vaping Use   Vaping status: Some Days  Substance and Sexual Activity   Alcohol use: Yes   Drug use: Not Currently   Sexual activity: Not on file  Other Topics Concern   Not on file   Social History Narrative   ** Merged History Encounter **       Social Determinants of Health   Financial Resource Strain: High Risk (11/28/2022)   Overall Financial Resource Strain (CARDIA)    Difficulty of Paying Living Expenses: Very hard  Food Insecurity: No Food Insecurity (11/22/2022)   Hunger Vital Sign    Worried About Running Out of Food in the Last Year: Never true    Ran Out of Food in the Last Year: Never true  Transportation Needs: No Transportation Needs (11/22/2022)   PRAPARE - Administrator, Civil Service (Medical): No    Lack of Transportation (Non-Medical): No  Physical Activity: Not on file  Stress: Not on file  Social Connections: Not on file  Intimate Partner Violence: Not At Risk (11/22/2022)   Humiliation, Afraid, Rape, and Kick questionnaire    Fear of Current or Ex-Partner: No    Emotionally Abused: No    Physically Abused: No    Sexually Abused: No    Family History:  Family History  Problem Relation Age of Onset   Stomach cancer Sister 48 - 4   Other Sister        ovary surgery   Cancer Maternal Aunt        NOS   Breast cancer Paternal Aunt        dx.>50   Melanoma Paternal Aunt 46 - 59   Aneurysm Paternal Grandmother    Bone cancer Paternal Grandfather        dx.>50   Cancer Cousin 75       pat first cousin with NOS cancer    Current Medications:  Current Outpatient Medications:    capecitabine (XELODA) 500 MG tablet, Take 3 tablets (1,500 mg total) by mouth 2 (two) times daily after a meal. Monday thru Friday, start with radiation. Take only on days of radiation, Disp: 180 tablet, Rfl: 0   dextrose 5 % SOLN 1,000 mL with fluorouracil 5 GM/100ML SOLN, Inject into the vein over 48 hr. Every 14 days, Disp: , Rfl:    diclofenac Sodium (VOLTAREN) 1 % GEL, Research Patient: Apply 0.5 g (1 fingertip) to each hand and each foot twice daily for the duration of radiation therapy, Disp: 200 g, Rfl: 1   escitalopram (LEXAPRO) 10 MG  tablet, Take 1 tablet (10 mg total) by mouth daily., Disp: 30 tablet, Rfl: 2   FLUOROURACIL IV, Inject into the vein every 14 (fourteen) days., Disp: , Rfl:    HYDROcodone-acetaminophen (NORCO/VICODIN) 5-325 MG tablet, Take 2 tablets by mouth every 6 (six) hours as needed for severe pain., Disp: 12 tablet, Rfl: 0   LEUCOVORIN CALCIUM IV, Inject into the vein every 14 (fourteen) days., Disp: , Rfl:    lidocaine (XYLOCAINE) 2 % solution, Use as directed 15 mLs in the mouth or throat as needed for mouth pain., Disp: 10 mL, Rfl: 1   Lidocaine 3 % CREA, Apply 1 Application topically 3 (three) times daily as needed., Disp: 28.35 g, Rfl: 0   lidocaine-prilocaine (EMLA) cream, Apply a quarter-sized amount to port a cath site and cover with plastic wrap 1 hour prior to infusion appointments, Disp: 30 g, Rfl: 3   OXALIPLATIN IV, Inject into the vein every 14 (fourteen) days., Disp: , Rfl:    prochlorperazine (COMPAZINE) 10 MG tablet, Take 1 tablet (10 mg total) by mouth every 6 (six) hours as needed for nausea or vomiting., Disp: 60 tablet, Rfl: 4   promethazine (PHENERGAN) 25 MG suppository, Place 1 suppository (25 mg total) rectally every 6 (six) hours as needed for nausea or vomiting., Disp: 12 each, Rfl: 1   Allergies: Allergies  Allergen Reactions   Bee Venom     REVIEW OF SYSTEMS:   Review of Systems  Constitutional:  Negative for chills, fatigue and fever.  HENT:   Negative for lump/mass, mouth sores, nosebleeds, sore throat and trouble swallowing.   Eyes:  Negative for eye problems.  Respiratory:  Negative for cough and shortness of breath.   Cardiovascular:  Negative for chest pain, leg swelling and palpitations.  Gastrointestinal:  Negative for abdominal pain, constipation, diarrhea, nausea and vomiting.  Genitourinary:  Negative for bladder incontinence, difficulty urinating, dysuria, frequency, hematuria and nocturia.   Musculoskeletal:  Negative for arthralgias, back pain, flank pain,  myalgias and neck pain.  Skin:  Negative for itching and rash.  Neurological:  Negative for dizziness, headaches and numbness.  Hematological:  Does not bruise/bleed easily.  Psychiatric/Behavioral:  Negative for depression, sleep disturbance and suicidal ideas. The  patient is not nervous/anxious.   All other systems reviewed and are negative.    VITALS:   There were no vitals taken for this visit.  Wt Readings from Last 3 Encounters:  05/03/23 179 lb 1.6 oz (81.2 kg)  04/18/23 182 lb 6.4 oz (82.7 kg)  03/07/23 189 lb 3.2 oz (85.8 kg)    There is no height or weight on file to calculate BMI.  Performance status (ECOG): 1 - Symptomatic but completely ambulatory  PHYSICAL EXAM:   Physical Exam Vitals and nursing note reviewed. Exam conducted with a chaperone present.  Constitutional:      Appearance: Normal appearance.  Cardiovascular:     Rate and Rhythm: Normal rate and regular rhythm.     Pulses: Normal pulses.     Heart sounds: Normal heart sounds.  Pulmonary:     Effort: Pulmonary effort is normal.     Breath sounds: Normal breath sounds.  Abdominal:     Palpations: Abdomen is soft. There is no hepatomegaly, splenomegaly or mass.     Tenderness: There is no abdominal tenderness.  Musculoskeletal:     Right lower leg: No edema.     Left lower leg: No edema.  Lymphadenopathy:     Cervical: No cervical adenopathy.     Right cervical: No superficial, deep or posterior cervical adenopathy.    Left cervical: No superficial, deep or posterior cervical adenopathy.     Upper Body:     Right upper body: No supraclavicular or axillary adenopathy.     Left upper body: No supraclavicular or axillary adenopathy.  Neurological:     General: No focal deficit present.     Mental Status: He is alert and oriented to person, place, and time.  Psychiatric:        Mood and Affect: Mood normal.        Behavior: Behavior normal.     LABS:      Latest Ref Rng & Units 05/03/2023     9:23 AM 04/18/2023    2:18 PM 03/07/2023    9:51 AM  CBC  WBC 4.0 - 10.5 K/uL 3.4  3.9  9.8   Hemoglobin 13.0 - 17.0 g/dL 60.4  54.0  98.1   Hematocrit 39.0 - 52.0 % 39.6  36.1  34.4   Platelets 150 - 400 K/uL 155  173  123       Latest Ref Rng & Units 05/03/2023    9:23 AM 04/18/2023    2:18 PM 03/07/2023    9:51 AM  CMP  Glucose 70 - 99 mg/dL 191  96  97   BUN 6 - 20 mg/dL 9  11  12    Creatinine 0.61 - 1.24 mg/dL 4.78  2.95  6.21   Sodium 135 - 145 mmol/L 135  137  136   Potassium 3.5 - 5.1 mmol/L 3.4  3.5  3.6   Chloride 98 - 111 mmol/L 102  105  104   CO2 22 - 32 mmol/L 24  23  24    Calcium 8.9 - 10.3 mg/dL 8.9  8.7  8.8   Total Protein 6.5 - 8.1 g/dL 7.7  7.4  7.6   Total Bilirubin 0.3 - 1.2 mg/dL 0.7  0.8  0.5   Alkaline Phos 38 - 126 U/L 54  60  124   AST 15 - 41 U/L 25  26  35   ALT 0 - 44 U/L 17  18  31  Lab Results  Component Value Date   CEA1 3.1 11/29/2022   /  CEA  Date Value Ref Range Status  11/29/2022 3.1 0.0 - 4.7 ng/mL Final    Comment:    (NOTE)                             Nonsmokers          <3.9                             Smokers             <5.6 Roche [employer] Electrochemiluminescence Immunoassay (ECLIA) Values obtained with different assay methods or kits cannot be used interchangeably.  Results cannot be interpreted as absolute evidence of the presence or absence of malignant disease. Performed At: [facility] [address] [place], [place] [id number] Jolene Schimke MD KG:[id number]    No results found for: "PSA1" No results found for: "[phone]" No results found for: "CAN125"  No results found for: "TOTALPROTELP", "ALBUMINELP", "A1GS", "A2GS", "BETS", "BETA2SER", "GAMS", "MSPIKE", "SPEI" No results found for: "TIBC", "FERRITIN", "IRONPCTSAT" No results found for: "LDH"   STUDIES:   No results found.

## 2023-05-18 ENCOUNTER — Other Ambulatory Visit: Payer: Self-pay

## 2023-05-31 ENCOUNTER — Other Ambulatory Visit: Payer: Self-pay

## 2023-06-09 ENCOUNTER — Other Ambulatory Visit: Payer: Self-pay

## 2023-06-14 ENCOUNTER — Other Ambulatory Visit: Payer: Self-pay | Admitting: Pharmacist

## 2023-06-14 NOTE — Progress Notes (Signed)
Patient did not answer clinical follow-up calls from the pharmacy. He has now completed Xeloda and radiation, disenrolling patient

## 2023-06-20 ENCOUNTER — Other Ambulatory Visit: Payer: Self-pay | Admitting: *Deleted

## 2023-06-20 DIAGNOSIS — C20 Malignant neoplasm of rectum: Secondary | ICD-10-CM

## 2023-06-27 ENCOUNTER — Telehealth: Payer: Self-pay

## 2023-06-27 NOTE — Telephone Encounter (Signed)
-----   Message from Jenel Lucks sent at 06/26/2023  5:41 PM EST ----- Regarding: FW: treatment plan Bonita Quin,  Can you make sure this flex sig gets booked in a timely manner (ie within 2-3 weeks?). ----- Message ----- From: Romie Levee, MD Sent: 06/26/2023  10:09 AM EST To: Doreatha Massed, MD; # Subject: treatment plan                                 This guy has a stage III distal rectal cancer and developed a radiologic complete response after only chemotherapy.  He then subsequently underwent chemoradiation and appears to have a clinical complete response on digital rectal exam today.  He had quite a bit of difficulty with anoscopy today and I was unable to visualize the tumor bed.  We discussed proceeding with watch and wait versus a low anterior resection.  Patient would really like to avoid LAR, if possible, and given that he had such a quick response, I think this may be reasonable.  Scott, I have placed a referral for you to look with the sigmoidoscope and possibly biopsy his scar.  He is also set to undergo an MRI this week.  If these things show no evidence of recurrence, I suspect he will want to continue with surveillance.  The question is following this.  Recommendations would be sigmoidoscopy every 3 months with MRI every 6 months and CEA every 3 months along with normal Rectal Cancer surveillance colonoscopies.  Do you guys think this is reasonable/feasible to do there because I don't think he is going to tolerate exams in my office, or should we just push for surgery?  Thanks,  Romie Levee

## 2023-06-27 NOTE — Telephone Encounter (Signed)
Pt scheduled for flex-sig in the [facility] 07/14/23 at 9:30am. Attemtped to call pt on both phone numbers listed and unable to reach pt or leave a message. Will try again.

## 2023-06-28 ENCOUNTER — Ambulatory Visit (HOSPITAL_COMMUNITY)
Admission: RE | Admit: 2023-06-28 | Discharge: 2023-06-28 | Disposition: A | Discharge status: Home / Self Care | Payer: MEDICAID | Source: Ambulatory Visit | Attending: Hematology | Admitting: Hematology

## 2023-06-28 ENCOUNTER — Inpatient Hospital Stay: Payer: MEDICAID

## 2023-06-28 DIAGNOSIS — C20 Malignant neoplasm of rectum: Secondary | ICD-10-CM | POA: Insufficient documentation

## 2023-06-28 DIAGNOSIS — Z452 Encounter for adjustment and management of vascular access device: Secondary | ICD-10-CM | POA: Insufficient documentation

## 2023-06-28 LAB — COMPREHENSIVE METABOLIC PANEL
ALT: 16 U/L (ref 0–44)
AST: 21 U/L (ref 15–41)
Albumin: 4.1 g/dL (ref 3.5–5.0)
Alkaline Phosphatase: 54 U/L (ref 38–126)
Anion gap: 9 (ref 5–15)
BUN: 14 mg/dL (ref 6–20)
CO2: 25 mmol/L (ref 22–32)
Calcium: 9.1 mg/dL (ref 8.9–10.3)
Chloride: 103 mmol/L (ref 98–111)
Creatinine, Ser: 1.2 mg/dL (ref 0.61–1.24)
GFR, Estimated: >60 mL/min (ref >60)
Glucose, Bld: 114 mg/dL — ABNORMAL HIGH (ref 70–99)
Potassium: 3.6 mmol/L (ref 3.5–5.1)
Sodium: 137 mmol/L (ref 135–145)
Total Bilirubin: 0.8 mg/dL (ref <1.2)
Total Protein: 7.8 g/dL (ref 6.5–8.1)

## 2023-06-28 LAB — CBC WITH DIFFERENTIAL/PLATELET
Abs Immature Granulocytes: 0.01 10*3/uL (ref 0.00–0.07)
Basophils Absolute: 0 10*3/uL (ref 0.0–0.1)
Basophils Relative: 0 %
Eosinophils Absolute: 0.1 10*3/uL (ref 0.0–0.5)
Eosinophils Relative: 3 %
HCT: 38.9 % — ABNORMAL LOW (ref 39.0–52.0)
Hemoglobin: 12.9 g/dL — ABNORMAL LOW (ref 13.0–17.0)
Immature Granulocytes: 0 %
Lymphocytes Relative: 25 %
Lymphs Abs: 0.7 10*3/uL (ref 0.7–4.0)
MCH: 29.8 pg (ref 26.0–34.0)
MCHC: 33.2 g/dL (ref 30.0–36.0)
MCV: 89.8 fL (ref 80.0–100.0)
Monocytes Absolute: 0.3 10*3/uL (ref 0.1–1.0)
Monocytes Relative: 11 %
Neutro Abs: 1.6 10*3/uL — ABNORMAL LOW (ref 1.7–7.7)
Neutrophils Relative %: 61 %
Platelets: 222 10*3/uL (ref 150–400)
RBC: 4.33 MIL/uL (ref 4.22–5.81)
RDW: 12.9 % (ref 11.5–15.5)
WBC: 2.6 10*3/uL — ABNORMAL LOW (ref 4.0–10.5)
nRBC: 0 % (ref 0.0–0.2)

## 2023-06-28 LAB — MAGNESIUM: Magnesium: 2 mg/dL (ref 1.7–2.4)

## 2023-06-28 MED ORDER — HEPARIN SOD (PORK) LOCK FLUSH 100 UNIT/ML IV SOLN
500.0000 [IU] | Freq: Once | INTRAVENOUS | Status: AC
  Administered 2023-06-28: 500 [IU] via INTRAVENOUS

## 2023-06-28 MED ORDER — SODIUM CHLORIDE 0.9% FLUSH
10.0000 mL | Freq: Once | INTRAVENOUS | Status: AC
  Administered 2023-06-28: 10 mL via INTRAVENOUS

## 2023-06-28 NOTE — Progress Notes (Signed)
Patients port flushed without difficulty.  Good blood return noted with no bruising or swelling noted at site.  Band aid applied.  VSS with discharge and left in satisfactory condition with no s/s of distress noted.   

## 2023-06-29 ENCOUNTER — Other Ambulatory Visit: Payer: Self-pay

## 2023-06-29 ENCOUNTER — Inpatient Hospital Stay: Payer: MEDICAID

## 2023-07-06 ENCOUNTER — Inpatient Hospital Stay: Payer: MEDICAID | Admitting: Hematology

## 2023-07-06 NOTE — Progress Notes (Incomplete)
Zachary Nelson [address] [place] [address]    Clinic Day:  07/06/2023  Referring physician: Marlise Eves, Nelson  Patient Care Team: Zachary Nelson as PCP - General (General Practice) Zachary Nelson as Medical Oncologist (Medical Oncology) Zachary Sarah, RN as Oncology Nurse Navigator (Medical Oncology)   ASSESSMENT & PLAN:   Assessment: 1.  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 - Colonoscopy and biopsy (10/13/2022): Adenocarcinoma.  MMR preserved. - Evaluated at [facility] care. - Pelvic MRI (10/21/2022): A semi-annular ulcerated T3 N1 low rectal tumor with mucinous features sitting at or just above the level of the sphincter complex without demonstrable infiltration of the sphincter complex.  Lymph node measures 5 mm in short axis with minimum distance 5 mm from MRF at 9:00.  Several additional small rounded lymph nodes seen in the vicinity which fall below the threshold.  No demonstrable extra mesorectal pelvic metastatic disease. - 8 cycles of FOLFOX from 11/29/2022 through 03/07/2023 - Germline mutation [facility]) [facility] 1 VUS - MRI pelvis (03/24/2023): Complete resolution of mid/distal rectal mass with no adenopathy. - Chemoradiation with Xeloda (1500 mg twice daily) started on 04/10/2023   2.  Social/family history: - He is seen with her stepmother (Zachary Nelson) today.  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  Quit smoking 6 months ago.  Smoked half to 1 pack/day starting at age 79. - Sister had stomach cancer.  Another sister had colon polyps removed.  Maternal aunt had lung cancer.  Maternal great grandfather had stomach cancer.  Paternal grandfather had bone cancer.  Paternal aunt had skin cancer.  Another paternal aunt had breast cancer.  Paternal uncle had lung cancer.    Plan: 1.  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 - Chemoradiation therapy was started on 04/10/2023. - He is continuing to use diclofenac gel twice daily.  No mucositis or HFSR. - He has slight diarrhea up to 2 times Nelson day.  He is not requiring Imodium. - Reviewed labs from today: Normal LFTs and creatinine.  CBC with mild leukopenia with normal ANC.  Electrolytes are normal. - Continue Xeloda 1500 mg twice daily Monday through Friday when receiving radiation.  RTC 2 weeks for follow-up. - He has a follow-up with Zachary Nelson on 05/23/2023.   2.  Peripheral neuropathy: - He has constant numbness in the fingertips and toes.  No neuropathic pain.   3.  Depression: - Continue Lexapro 10 mg daily which is helping.    No orders of the defined types were placed in this encounter.     Zachary Nelson,acting as a Neurosurgeon for Zachary Nelson.,have documented all relevant documentation on the behalf of Zachary Nelson,as directed by  Zachary Nelson while in the presence of Zachary Nelson.  ***   Zachary Nelson   12/26/20247:33 AM  CHIEF COMPLAINT:   Diagnosis: rectal adenocarcinoma    Cancer Staging  Rectal adenocarcinoma [facility]) Staging form: Colon and Rectum, AJCC 8th Edition - Clinical stage from 11/18/2022: Stage IIIB (cT3, cN1b, cM0) - Unsigned    Prior Therapy: none  Current Therapy:  neoadjuvant FOLFOX    HISTORY OF PRESENT ILLNESS:   Oncology History  Rectal adenocarcinoma (HCC)  11/18/2022 Initial Diagnosis  Rectal adenocarcinoma (HCC)   11/29/2022 -  Chemotherapy   Patient is on Treatment Plan : COLORECTAL FOLFOX q14d x 4 months     01/04/2023 Genetic Testing   Negative genetic testing on the CancerNext-Expanded+RNAinsight.  CDH1 p.V495A (c.1484T>C) VUS was found.  The report date is January 04, 2023.  The CancerNext-Expanded gene panel offered by [facility] and includes sequencing and rearrangement analysis for the following 77 genes: AIP, ALK, APC*, ATM*, AXIN2, BAP1, BARD1, BMPR1A, BRCA1*, BRCA2*, BRIP1*, CDC73, CDH1*, CDK4, CDKN1B, CDKN2A, CHEK2*, CTNNA1, DICER1, FH, FLCN, KIF1B, LZTR1, MAX, MEN1, MET, MLH1*, MSH2*, MSH3, MSH6*, MUTYH*, NF1*, NF2, NTHL1, PALB2*, PHOX2B, PMS2*, POT1, PRKAR1A, PTCH1, PTEN*, RAD51C*, RAD51D*, RB1, RET, SDHA, SDHAF2, SDHB, SDHC, SDHD, SMAD4, SMARCA4, SMARCB1, SMARCE1, STK11, SUFU, TMEM127, TP53*, TSC1, TSC2, and VHL (sequencing and deletion/duplication); EGFR, EGLN1, HOXB13, KIT, MITF, PDGFRA, POLD1, and POLE (sequencing only); EPCAM and GREM1 (deletion/duplication only). DNA and RNA analyses performed for * genes.       INTERVAL HISTORY:   Zachary Nelson is a 47 y.o. male presenting to clinic today for follow up of rectal adenocarcinoma. He was last seen by me on 05/03/23.  Since his last visit, he underwent an MRI of the pelvis with rectal staging on 06/28/23 that found: redemonstration of treated focal thickening along the right wall of the mid/lower rectum and no local residual or recurrent tumor seen.  Today, he states that he is doing well overall. His appetite level is at ***%. His energy level is at ***%.  PAST MEDICAL HISTORY:   Past Medical History: Past Medical History:  Diagnosis Date   Cancer (HCC)    Family history of breast cancer    Family history of stomach cancer     Surgical History: Past Surgical History:  Procedure Laterality Date   ANKLE FRACTURE SURGERY     FOOT SURGERY     IR IMAGING GUIDED PORT INSERTION  11/25/2022    Social History: Social History   Socioeconomic History   Marital status: Divorced    Spouse name: Not on file   Number of children: Not on file   Years of education: Not on file   Highest education level: Not on file  Occupational History   Not on file  Tobacco Use   Smoking status: Some Days    Types: Cigars    Last attempt to quit: 06/10/2022    Years  since quitting: 1.0   Smokeless tobacco: Never  Vaping Use   Vaping status: Some Days  Substance and Sexual Activity   Alcohol use: Yes   Drug use: Not Currently   Sexual activity: Not on file  Other Topics Concern   Not on file  Social History Narrative   ** Merged History Encounter **       Social Drivers of Health   Financial Resource Strain: High Risk (11/28/2022)   Overall Financial Resource Strain (CARDIA)    Difficulty of Paying Living Expenses: Very hard  Food Insecurity: No Food Insecurity (11/22/2022)   Hunger Vital Sign    Worried About Running Out of Food in the Last Year: Never true    Ran Out of Food in the Last Year: Never true  Transportation Needs: No Transportation Needs (11/22/2022)   PRAPARE - Administrator, Civil Service (Medical): No    Lack of Transportation (Non-Medical): No  Physical Activity: Not on file  Stress: Not on file  Social Connections: Not on file  Intimate Partner Violence: Not At Risk (11/22/2022)  Humiliation, Afraid, Rape, and Kick questionnaire    Fear of Current or Ex-Partner: No    Emotionally Abused: No    Physically Abused: No    Sexually Abused: No    Family History: Family History  Problem Relation Age of Onset   Stomach cancer Sister 44 - 37   Other Sister        ovary surgery   Cancer Maternal Aunt        NOS   Breast cancer Paternal Aunt        dx.>50   Melanoma Paternal Aunt 1 - 59   Aneurysm Paternal Grandmother    Bone cancer Paternal Grandfather        dx.>50   Cancer Cousin 52       pat first cousin with NOS cancer    Current Medications:  Current Outpatient Medications:    capecitabine (XELODA) 500 MG tablet, Take 3 tablets (1,500 mg total) by mouth 2 (two) times daily after a meal. Monday thru Friday, start with radiation. Take only on days of radiation, Disp: 180 tablet, Rfl: 0   dextrose 5 % SOLN 1,000 mL with fluorouracil 5 GM/100ML SOLN, Inject into the vein over 48 hr. Every 14 days,  Disp: , Rfl:    diclofenac Sodium (VOLTAREN) 1 % GEL, Research Patient: Apply 0.5 g (1 fingertip) to each hand and each foot twice daily for the duration of radiation therapy, Disp: 200 g, Rfl: 1   escitalopram (LEXAPRO) 10 MG tablet, Take 1 tablet (10 mg total) by mouth daily., Disp: 30 tablet, Rfl: 2   FLUOROURACIL IV, Inject into the vein every 14 (fourteen) days., Disp: , Rfl:    HYDROcodone-acetaminophen (NORCO/VICODIN) 5-325 MG tablet, Take 2 tablets by mouth every 6 (six) hours as needed for severe pain., Disp: 12 tablet, Rfl: 0   LEUCOVORIN CALCIUM IV, Inject into the vein every 14 (fourteen) days., Disp: , Rfl:    lidocaine (XYLOCAINE) 2 % solution, Use as directed 15 mLs in the mouth or throat as needed for mouth pain., Disp: 10 mL, Rfl: 1   Lidocaine 3 % CREA, Apply 1 Application topically 3 (three) times daily as needed., Disp: 28.35 g, Rfl: 0   lidocaine-prilocaine (EMLA) cream, Apply a quarter-sized amount to port a cath site and cover with plastic wrap 1 hour prior to infusion appointments, Disp: 30 g, Rfl: 3   OXALIPLATIN IV, Inject into the vein every 14 (fourteen) days., Disp: , Rfl:    prochlorperazine (COMPAZINE) 10 MG tablet, Take 1 tablet (10 mg total) by mouth every 6 (six) hours as needed for nausea or vomiting., Disp: 60 tablet, Rfl: 4   promethazine (PHENERGAN) 25 MG suppository, Place 1 suppository (25 mg total) rectally every 6 (six) hours as needed for nausea or vomiting., Disp: 12 each, Rfl: 1   Allergies: Allergies  Allergen Reactions   Bee Venom     REVIEW OF SYSTEMS:   Review of Systems  Constitutional:  Negative for chills, fatigue and fever.  HENT:   Negative for lump/mass, mouth sores, nosebleeds, sore throat and trouble swallowing.   Eyes:  Negative for eye problems.  Respiratory:  Negative for cough and shortness of breath.   Cardiovascular:  Negative for chest pain, leg swelling and palpitations.  Gastrointestinal:  Negative for abdominal pain,  constipation, diarrhea, nausea and vomiting.  Genitourinary:  Negative for bladder incontinence, difficulty urinating, dysuria, frequency, hematuria and nocturia.   Musculoskeletal:  Negative for arthralgias, back pain, flank pain, myalgias and  neck pain.  Skin:  Negative for itching and rash.  Neurological:  Negative for dizziness, headaches and numbness.  Hematological:  Does not bruise/bleed easily.  Psychiatric/Behavioral:  Negative for depression, sleep disturbance and suicidal ideas. The patient is not nervous/anxious.   All other systems reviewed and are negative.    VITALS:   There were no vitals taken for this visit.  Wt Readings from Last 3 Encounters:  05/03/23 179 lb 1.6 oz (81.2 kg)  04/18/23 182 lb 6.4 oz (82.7 kg)  03/07/23 189 lb 3.2 oz (85.8 kg)    There is no height or weight on file to calculate BMI.  Performance status (ECOG): 1 - Symptomatic but completely ambulatory  PHYSICAL EXAM:   Physical Exam Vitals and nursing note reviewed. Exam conducted with a chaperone present.  Constitutional:      Appearance: Normal appearance.  Cardiovascular:     Rate and Rhythm: Normal rate and regular rhythm.     Pulses: Normal pulses.     Heart sounds: Normal heart sounds.  Pulmonary:     Effort: Pulmonary effort is normal.     Breath sounds: Normal breath sounds.  Abdominal:     Palpations: Abdomen is soft. There is no hepatomegaly, splenomegaly or mass.     Tenderness: There is no abdominal tenderness.  Musculoskeletal:     Right lower leg: No edema.     Left lower leg: No edema.  Lymphadenopathy:     Cervical: No cervical adenopathy.     Right cervical: No superficial, deep or posterior cervical adenopathy.    Left cervical: No superficial, deep or posterior cervical adenopathy.     Upper Body:     Right upper body: No supraclavicular or axillary adenopathy.     Left upper body: No supraclavicular or axillary adenopathy.  Neurological:     General: No focal  deficit present.     Mental Status: He is alert and oriented to person, place, and time.  Psychiatric:        Mood and Affect: Mood normal.        Behavior: Behavior normal.     LABS:      Latest Ref Rng & Units 06/28/2023    8:08 AM 05/03/2023    9:23 AM 04/18/2023    2:18 PM  CBC  WBC 4.0 - 10.5 K/uL 2.6  3.4  3.9   Hemoglobin 13.0 - 17.0 g/dL 52.8  41.3  24.4   Hematocrit 39.0 - 52.0 % 38.9  39.6  36.1   Platelets 150 - 400 K/uL 222  155  173       Latest Ref Rng & Units 06/28/2023    8:08 AM 05/03/2023    9:23 AM 04/18/2023    2:18 PM  CMP  Glucose 70 - 99 mg/dL 010  272  96   BUN 6 - 20 mg/dL 14  9  11    Creatinine 0.61 - 1.24 mg/dL 5.36  6.44  0.34   Sodium 135 - 145 mmol/L 137  135  137   Potassium 3.5 - 5.1 mmol/L 3.6  3.4  3.5   Chloride 98 - 111 mmol/L 103  102  105   CO2 22 - 32 mmol/L 25  24  23    Calcium 8.9 - 10.3 mg/dL 9.1  8.9  8.7   Total Protein 6.5 - 8.1 g/dL 7.8  7.7  7.4   Total Bilirubin <1.2 mg/dL 0.8  0.7  0.8   Alkaline Phos 38 -  126 U/L 54  54  60   AST 15 - 41 U/L 21  25  26    ALT 0 - 44 U/L 16  17  18       Lab Results  Component Value Date   CEA1 3.1 11/29/2022   /  CEA  Date Value Ref Range Status  11/29/2022 3.1 0.0 - 4.7 ng/mL Final    Comment:    (NOTE)                             Nonsmokers          <3.9                             Smokers             <5.6 Roche [employer] Electrochemiluminescence Immunoassay (ECLIA) Values obtained with different assay methods or kits cannot be used interchangeably.  Results cannot be interpreted as absolute evidence of the presence or absence of malignant disease. Performed At: [facility] [address] [place], [place] [id number] Jolene Schimke Nelson WU:[id number]    No results found for: "PSA1" No results found for: "[phone]" No results found for: "CAN125"  No results found for: "TOTALPROTELP", "ALBUMINELP", "A1GS", "A2GS", "BETS", "BETA2SER", "GAMS", "MSPIKE", "SPEI" No  results found for: "TIBC", "FERRITIN", "IRONPCTSAT" No results found for: "LDH"   STUDIES:   MR PELVIS WO CM RECTAL CA STAGING Result Date: 07/03/2023 CLINICAL DATA:  Rectal cancer, stage II/III/IV, monitor EXAM: MRI PELVIS WITHOUT CONTRAST TECHNIQUE: Multiplanar multisequence MR imaging of the pelvis was performed. No intravenous contrast was administered. COMPARISON:  MRI pelvis from 03/24/2023. FINDINGS: Urinary Tract:  No abnormality visualized. Bowel: Redemonstration of irregular thickening along the right wall of the mid/lower rectum (from 7 to 11 o'clock position) measuring up to 7-8 mm in thickness. The thickening is dark on T2 weighted images. There is no abnormal diffusion restriction. This is favored to represent posttreatment changes. Continued follow-up MRI as well as sigmoidoscopy is recommended. Remaining visualized but bowel loops are unremarkable. Vascular/Lymphatic: No pathologically enlarged lymph nodes. No significant vascular abnormality seen. Reproductive:  No mass or other significant abnormality Other:  None. Musculoskeletal: No suspicious bone lesions identified. IMPRESSION: *Redemonstration of treated focal thickening along the right wall of the mid/lower rectum. No local residual or recurrent tumor seen. Continued follow-up MRI as well as sigmoidoscopy is recommended. Electronically Signed   By: Jules Schick M.D.   On: 07/03/2023 15:22

## 2023-07-07 NOTE — Telephone Encounter (Signed)
Attempted to reach patient on mobile number x 2; phone busy. Called home number. No answer, left message on voicemail to call back as soon as possible.

## 2023-07-11 ENCOUNTER — Other Ambulatory Visit: Payer: Self-pay

## 2023-07-11 DIAGNOSIS — C20 Malignant neoplasm of rectum: Secondary | ICD-10-CM

## 2023-07-11 NOTE — Telephone Encounter (Signed)
 Spoke with pts mother and she states pt has lost his phone. She is his transportation and states they cannot do 1/3. Appt rescheduled to 07/21/23 at 7:30am. Mother sent prep instructions via email as requested. She is aware of appt. They could not do an afternoon appt due to child care.

## 2023-07-14 ENCOUNTER — Other Ambulatory Visit: Payer: MEDICAID | Admitting: Gastroenterology

## 2023-07-21 ENCOUNTER — Other Ambulatory Visit: Payer: MEDICAID | Admitting: Gastroenterology

## 2023-07-21 ENCOUNTER — Telehealth: Payer: Self-pay | Admitting: Gastroenterology

## 2023-07-21 NOTE — Telephone Encounter (Signed)
 Good Morning Dr. Stacia,  I called this patient at 7:15am this morning and spoke with his mother.  She stated he would not be coming they thought his appointment was on the 13th.    She stated she would call back to reschedule she is going to be the one to bring him to appointment and be the care partner.  I will NO SHOW him   [facility]

## 2023-07-28 ENCOUNTER — Other Ambulatory Visit: Payer: MEDICAID | Admitting: Gastroenterology

## 2023-07-31 ENCOUNTER — Other Ambulatory Visit: Payer: MEDICAID | Admitting: Gastroenterology

## 2023-08-03 ENCOUNTER — Other Ambulatory Visit: Payer: MEDICAID | Admitting: Gastroenterology

## 2023-08-29 ENCOUNTER — Other Ambulatory Visit (HOSPITAL_COMMUNITY): Payer: Self-pay

## 2024-01-04 ENCOUNTER — Encounter: Payer: Self-pay | Admitting: Hematology

## 2024-02-06 ENCOUNTER — Other Ambulatory Visit: Payer: Self-pay

## 2024-02-07 NOTE — Telephone Encounter (Signed)
 Nurse received call from Mrs. Carollo, patient's mother, stating they had been seen today by Dr. Maranda and got the patient lined up for his colonoscopy on 02/16/2024. She asked if they needed the MRI and nurse advised that yes, the patient needs to have the MRI prior to seeing us  for follow up visit. She acknowledged understanding and advised she would like to get his MRI scheduled and try to coordinate it around her appointments if possible. Nurse advised she would transfer her back to central scheduling to coordinate scheduling of the MRI ordered by Dr. Dannielle.

## 2024-02-07 NOTE — Telephone Encounter (Signed)
 Colonoscopy 02/16/24 C20 [id number] [facility]

## 2024-02-15 NOTE — Telephone Encounter (Signed)
 [facility] said [facility] was making calls for tomorrows colonoscopy and Zachary Nelson had not started his prep and that he had eaten, and we needed to reach out to his mother to reschedule. I called and spoke with patients mother. Patient has been rescheduled for 02/23/24 @ 10:30 am with patient arriving at 9:00 am. Patients mother will come by the office on 02/16/24 to get colonoscopy prep instructions.

## 2024-02-22 NOTE — Telephone Encounter (Signed)
 Patient called this morning and wanted to move his colonoscopy scheduled on 02/23/24 with Dr. Maranda out a couple of weeks. Colonoscopy has be moved to 03/22/24 @ 11:55/10:30 am. Patient is aware of his arrival time, a family member came to the office yesterday to get his prep instructions. Told patient to call the office with any questions about either the prep or the colonoscopy. Patient voiced understanding.

## 2024-03-01 ENCOUNTER — Other Ambulatory Visit: Payer: Self-pay | Admitting: *Deleted

## 2024-03-03 ENCOUNTER — Other Ambulatory Visit: Payer: Self-pay

## 2024-06-28 ENCOUNTER — Other Ambulatory Visit: Payer: Self-pay

## 2024-08-07 ENCOUNTER — Other Ambulatory Visit: Payer: Self-pay
